# Patient Record
Sex: Female | Born: 1955 | Race: White | Hispanic: No | Marital: Married | Smoking: Never smoker
Health system: Southern US, Community
[De-identification: ages and names within clinical notes are randomized; demographics above are authoritative.]

## PROBLEM LIST (undated history)

## (undated) DIAGNOSIS — I10 Essential (primary) hypertension: Secondary | ICD-10-CM

## (undated) HISTORY — DX: Essential (primary) hypertension: I10

---

## 1998-06-14 ENCOUNTER — Other Ambulatory Visit: Admission: RE | Admit: 1998-06-14 | Discharge: 1998-06-14 | Payer: Self-pay | Admitting: Obstetrics and Gynecology

## 1999-07-17 ENCOUNTER — Other Ambulatory Visit: Admission: RE | Admit: 1999-07-17 | Discharge: 1999-07-17 | Payer: Self-pay | Admitting: Obstetrics and Gynecology

## 2000-07-30 ENCOUNTER — Other Ambulatory Visit: Admission: RE | Admit: 2000-07-30 | Discharge: 2000-07-30 | Payer: Self-pay | Admitting: Obstetrics and Gynecology

## 2001-10-07 ENCOUNTER — Other Ambulatory Visit: Admission: RE | Admit: 2001-10-07 | Discharge: 2001-10-07 | Payer: Self-pay | Admitting: Obstetrics and Gynecology

## 2002-11-02 ENCOUNTER — Other Ambulatory Visit: Admission: RE | Admit: 2002-11-02 | Discharge: 2002-11-02 | Payer: Self-pay | Admitting: Obstetrics and Gynecology

## 2003-11-09 ENCOUNTER — Other Ambulatory Visit: Admission: RE | Admit: 2003-11-09 | Discharge: 2003-11-09 | Payer: Self-pay | Admitting: Obstetrics and Gynecology

## 2004-02-22 ENCOUNTER — Encounter (INDEPENDENT_AMBULATORY_CARE_PROVIDER_SITE_OTHER): Payer: Self-pay | Admitting: Specialist

## 2004-02-22 ENCOUNTER — Ambulatory Visit (HOSPITAL_COMMUNITY): Admission: RE | Admit: 2004-02-22 | Discharge: 2004-02-22 | Payer: Self-pay | Admitting: Obstetrics and Gynecology

## 2004-11-28 ENCOUNTER — Other Ambulatory Visit: Admission: RE | Admit: 2004-11-28 | Discharge: 2004-11-28 | Payer: Self-pay | Admitting: Obstetrics and Gynecology

## 2007-07-29 ENCOUNTER — Encounter: Admission: RE | Admit: 2007-07-29 | Discharge: 2007-09-04 | Payer: Self-pay | Admitting: Family Medicine

## 2009-07-28 ENCOUNTER — Encounter: Admission: RE | Admit: 2009-07-28 | Discharge: 2009-08-22 | Payer: Self-pay | Admitting: Neurology

## 2010-01-10 ENCOUNTER — Ambulatory Visit: Payer: Self-pay | Admitting: Family Medicine

## 2010-01-10 DIAGNOSIS — M999 Biomechanical lesion, unspecified: Secondary | ICD-10-CM | POA: Insufficient documentation

## 2010-01-10 DIAGNOSIS — I10 Essential (primary) hypertension: Secondary | ICD-10-CM

## 2010-01-26 ENCOUNTER — Ambulatory Visit: Payer: Self-pay | Admitting: Family Medicine

## 2010-01-26 LAB — CONVERTED CEMR LAB
ALT: 14 units/L
AST: 19 units/L
Albumin: 4.7 g/dL
Alkaline Phosphatase: 67 units/L
GGT: 16 units/L
Glucose, Bld: 70 mg/dL
HDL: 95 mg/dL
Total Protein: 7 g/dL

## 2010-06-13 NOTE — Assessment & Plan Note (Signed)
Summary: np,df   Vital Signs:  Patient profile:   55 year old female Height:      61 inches Weight:      115.6 pounds BMI:     21.92 Temp:     98.6 degrees F oral Pulse rate:   106 / minute BP sitting:   144 / 85  (left arm) Cuff size:   regular  Vitals Entered By: Garen Grams LPN (January 10, 2010 3:21 PM) CC: New Patient Is Patient Diabetic? No Pain Assessment Patient in pain? no        Primary Care Provider:  Antoine Primas DO  CC:  New Patient.  History of Present Illness: 55 yo female with hx of htn here to establish care.  Pt is switching because pt had heard about OMT and though that would be of benefit.    1.  HTN- Well controlled with lisinopril 15mg  by mouth daily pt a little high today but states most of the time it is 120's SBP.  Pt denies HA visual changes abdominal pain weakness or numbness in extremities.  Pt states about 2 months ago had all labs done and will send them to [place].    2.  left sided back pain-  Pt is very active and recently has been hindered by back pain from some acitvity, sometimes it radiates to her leg but usually that is when she has knee pain as well.  notices the pain is worse when she wears certain tennis shoes but can happen in other shoes and with increaseing activity such as walking more than two miles.  Pt denies bowel or bladder problems, weakness in extremity or numbness, pt though feels when it does hurt she has pain on her left side of her bosy including her shoulder, neck, knee and ankle but always seems to start with her back.  Pt was told by a relative in law about OMT and would like to see if she benefits.  3.  Preventitive-  Pt goes to her gyn for her pap and mamopgram, had a coloncoscopy 5 years ago and is checking if she needs another one (was seen at [place]). Hx of basal cell on her face.   Habits & Providers  Alcohol-Tobacco-Diet     Tobacco Status: never  Current Medications (verified): 1)  Lisinopril 10 Mg Tabs  (Lisinopril) .Marland Kitchen.. 1 and 1/2 Tab Daily 2)  Aspirin 81 Mg Tabs (Aspirin) .Marland Kitchen.. 1 Tab Daily 3)  Fish Oil 500 Mg Caps (Omega-3 Fatty Acids) .... 3 Caps Daily 4)  Cvs Daily Multiple Plus Iron  Tabs (Multiple Vitamins-Iron)  Allergies (verified): No Known Drug Allergies  Past History:  Past Medical History: HTN Basal cell on face  Past Surgical History: C section in 1985  Family History: breast cancer in mother at 50 stroke in father at age 61  Social History: walks 6x a week for exercise, lives with husband and daughter , denies smoking and illicits ETOH rarely, 2 dogs works as a Youth worker. Smoking Status:  never  Review of Systems       see hpi but denies fever, chills, nausea, vomiting, diarrhea or constipation   Physical Exam  General:  Well-developed,well-nourished,in no acute distress; alert,appropriate and cooperative throughout examination Eyes:  PERRLA, EOMI Ears:  TM intact Mouth:  MMM Lungs:  CTAB Heart:  RRR no murmur Abdomen:  BS+, NT, ND Msk:  OMT findings T3RS right T7RSl elevated left 1st rib L2=3 RS left  left anterior sacrum  anterior left innominate mild left leg discrepency   Pulses:  R and L carotid,radial,femoral,dorsalis pedis and posterior tibial pulses are full and equal bilaterally Extremities:  No clubbing, cyanosis, edema, or deformity noted with normal full range of motion of all joints.   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 Told can return if needed more manipulation.  If continue pt does have mild leg length shortening on left that might be helped with lift and then would send  to sports medicine.  Orders: OMT 1-2 [facility] [phone])  Problem # 2:  ESSENTIAL HYPERTENSION, BENIGN (ICD-401.1) A little high today, if hgh again at next visit then would increase lisinopril to 20, would also get labs.  Pt is going to give [place] tohe recent labs she has had.  Her updated medication list for this problem includes:    Lisinopril 10 Mg Tabs (Lisinopril) .Marland Kitchen... 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  Orders: OMT 1-2 [facility] [phone])  Complete Medication List: 1)  Lisinopril 10 Mg Tabs (Lisinopril) .Marland Kitchen.. 1 and 1/2 tab daily 2)  Aspirin 81 Mg Tabs (Aspirin) .Marland Kitchen.. 1 tab daily 3)  Fish Oil 500 Mg Caps (Omega-3 fatty acids) .... 3 caps daily 4)  Cvs Daily Multiple Plus Iron Tabs (Multiple vitamins-iron)  Patient Instructions: 1)  Nice to meet you 2)  For your left side pain, we can do manipulation when you need it, hoepfully that won't be to frequently but we will play it by ear. 3)  If you have a flare please take motrin, you should feel better in the AM 4)  Do wall sits, 3 set of 30 seconds 1 time a day at 45 degree angle 5)  I want you to do hip flexor stretch, one leg down, back straight step forward with opposite leg until you feel a stretch, hold ten seconds, relax and then step a little further, do that 2 x daily.  6)  Continue the other exercises 7)  Cycling maybe a good option as well but the bike should be the right silze for you 8)  Please send in a copy of your lab work 9)  If you need anything else don't hesitate to call.  Prescriptions: LISINOPRIL 10 MG TABS (LISINOPRIL) 1 and 1/2 tab daily  #45 x 3   Entered and Authorized by:   Antoine Primas DO   Signed by:   Antoine Primas DO on 01/11/2010   Method used:   Historical   RxID:   [id number]

## 2010-06-13 NOTE — Assessment & Plan Note (Signed)
Summary: f/u eo   Vital Signs:  Patient profile:   55 year old female Height:      61 inches Weight:      114.9 pounds Temp:     98.4 degrees F oral Pulse rate:   94 / minute BP sitting:   116 / 73  (left arm) Cuff size:   regular  Vitals Entered By: Garen Grams LPN (January 26, 2010 2:26 PM)  Primary Care Provider:  Antoine Primas DO   History of Present Illness: 55 yo female with hx of htn here for f/u  1.  HTN- Well controlled with lisinopril 15mg  by mouth dailyPt at goal today  Pt denies HA visual changes abdominal pain weakness or numbness in extremities.  Pt brought labs from previous blood draw and will be put into flowsheet.   2.  left sided back pain-  Pt states that it is tremendously better has been able to do much more activity but still having a little pain from time to time.  has been doing exercises but wants something to help strenthenankle.  Pt also have a little more left sided upper back pain this time.  Pt states that it can go to the shoulder as well.  Pt deneis any numbness or loss of function in any of the extremities.     Current Medications (verified): 1)  Lisinopril 10 Mg Tabs (Lisinopril) .Marland Kitchen.. 1 and 1/2 Tab Daily 2)  Aspirin 81 Mg Tabs (Aspirin) .Marland Kitchen.. 1 Tab Daily 3)  Fish Oil 500 Mg Caps (Omega-3 Fatty Acids) .... 3 Caps Daily 4)  Cvs Daily Multiple Plus Iron  Tabs (Multiple Vitamins-Iron)  Allergies (verified): No Known Drug Allergies  Past History:  Past medical, surgical, family and social histories (including risk factors) reviewed, and no changes noted (except as noted below).  Past Medical History: Reviewed history from 01/10/2010 and no changes required. HTN Basal cell on face  Past Surgical History: Reviewed history from 01/10/2010 and no changes required. C section in 1985  Family History: Reviewed history from 01/10/2010 and no changes required. breast cancer in mother at 22 stroke in father at age 68  Social  History: Reviewed history from 01/10/2010 and no changes required. 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   Review of Systems       no bowel or bladder problems, no chest pain no SOB or abdominal pain  Physical Exam  General:  Well-developed,well-nourished,in no acute distress; alert,appropriate and cooperative throughout examination Mouth:  MMM Lungs:  CTAB Heart:  RRR no murmur Msk:  OMT findings T3RS right  elevated left 1st rib L2-3 RS left  left anterior sacrum anterior left innominate mild left leg discrepency- physilogical.   Extremities:  No clubbing, cyanosis, edema, or deformity noted with normal full range of motion of all joints.     Impression & Recommendations:  Problem # 1:  ESSENTIAL HYPERTENSION, BENIGN (ICD-401.1) stable at goal input numbers into flowsheet.  Her updated medication list for this problem includes:    Lisinopril 10 Mg Tabs (Lisinopril) .Marland Kitchen... 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  Pt very happy because insurence  covered almost entire visit so will be coming more frequently.  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 will monitor. does not seem to have a anatomical leg length prblem and all physiological. Orders: [facility]- Est Level  3 (99213) OMT 1-2 [facility] (57846)  Complete Medication List: 1)  Lisinopril 10 Mg Tabs (Lisinopril) .Marland Kitchen.. 1 and 1/2 tab daily 2)  Aspirin 81 Mg Tabs (Aspirin) .Marland Kitchen.. 1 tab  daily 3)  Fish Oil 500 Mg Caps (Omega-3 fatty acids) .... 3 caps daily 4)  Cvs Daily Multiple Plus Iron Tabs (Multiple vitamins-iron)

## 2010-06-17 ENCOUNTER — Encounter: Payer: Self-pay | Admitting: *Deleted

## 2010-09-13 ENCOUNTER — Ambulatory Visit (INDEPENDENT_AMBULATORY_CARE_PROVIDER_SITE_OTHER): Payer: BC Managed Care – PPO | Admitting: Family Medicine

## 2010-09-13 ENCOUNTER — Encounter: Payer: Self-pay | Admitting: Family Medicine

## 2010-09-13 DIAGNOSIS — M999 Biomechanical lesion, unspecified: Secondary | ICD-10-CM

## 2010-09-13 NOTE — Progress Notes (Signed)
  Subjective:    Patient ID: Laura Rios, female    DOB: Oct 11, 1955, 55 y.o.   MRN: [id number]  HPI  Pt is here for maipulation Pt had been seen for this before 7 months ago and was doing well up to the last 3-4 weeks.  Pt states she has some pain on her left side pt states it usually starts in her low back and can radiate to her knee notices it more when she sits, does notice relief if she stretches some, has noticed some different shoes make it better or worse, no numbness no LOF, still able to do ADL's Denies bowel or bladder problems or any type of trauma.   Review of Systems Denies fever, chills, nausea vomiting abdominal pain, dysuria, chest pain, shortness of breath dyspnea on exertion or numbness in extremities  Past medical, surgical and family hx were reviewed with no changes.     Objective:   Physical Exam    Gen: NAd Pul: CTAB CVV RRR  OMT Findings: Cervical: C2 left, C4 right Thoracic: T5 rotated and side bent right Lumbar:L2 rotated and side bent right Anterior left illium Sacrum:posterior left sacrum.      Assessment & Plan:

## 2010-09-13 NOTE — Assessment & Plan Note (Signed)
After verbal consent pt had HVLA on side and muscle energy prone with marked improvement.

## 2010-09-13 NOTE — Assessment & Plan Note (Signed)
Marked improvement after HVLA on supine with improvement in marked asymmetry noted earlier.

## 2010-09-29 NOTE — H&P (Signed)
Laura Rios, LEAR NO.:  [id number]   MEDICAL RECORD NO.:  [id number]          PATIENT TYPE:  AMB   LOCATION:  SDC                           FACILITY:  [facility]   PHYSICIAN:  Juluis Mire, M.D.   DATE OF BIRTH:  Dec 17, 1955   DATE OF ADMISSION:  DATE OF DISCHARGE:                                HISTORY & PHYSICAL   CHIEF COMPLAINT:  The patient is a 55 year old gravida 3 para 3 married  white female who presents for a hysteroscopy.   PRESENT ADMISSION:  The patient has been followed with menorrhagia.  She has  been having excessively heavier periods.  Cycles are approximately every 24-  26 days.  She occasionally does have two periods per month.  Hemoglobin was  noted to be slightly depressed.  Subsequently, we did a saline infusion  ultrasound in the office that revealed inhomogeneous myometrium consistent  with possible adenomyosis and a probable endometrial polyp.  Saline infusion  ultrasound confirmed the endometrial polyp and now she presents for a  hysteroscopic resection of the polyp.   ALLERGIES:  No known drug allergies.   MEDICATIONS:  Include Accupril and aspirin.   PAST MEDICAL HISTORY:  Usual childhood diseases without any significant  sequelae.   SURGICAL HISTORY:  She has had one prior cesarean section and two vaginal  deliveries.   FAMILY HISTORY:  Noncontributory.   SOCIAL HISTORY:  Reveals no tobacco or alcohol use.   REVIEW OF SYSTEMS:  Noncontributory.   PHYSICAL EXAMINATION:  VITAL SIGNS:  The patient is afebrile with stable  vital signs.  HEENT:  The patient is normocephalic.  Pupils equal, round, and reactive to  light and accommodation.  Extraocular movements were intact.  Sclerae and  conjunctivae were clear, oropharynx clear.  NECK:  Without thyromegaly.  BREASTS:  No discrete masses.  LUNGS:  Clear.  CARDIOVASCULAR:  Regular rate and rhythm without murmurs or gallops.  ABDOMEN:  Benign.  No mass, organomegaly, or  tenderness.  PELVIC:  Normal external genitalia, vaginal mucosa is clear.  Cervix  unremarkable.  Uterus normal size, shape, and contour.  Adnexa free of  masses or tenderness.  EXTREMITIES:  Trace edema.  NEUROLOGIC:  Grossly within normal limits.   IMPRESSION:  Endometrial polyp with increasing menstrual flow.   PLAN:  The patient to undergo a hysteroscopic evaluation and resection of  polyp.  The risks of surgery have been discussed including the risk of  infection; the risk of hemorrhage that could require transfusion with the  risk of AIDS or hepatitis; the risk of injury to adjacent organs including  bladder or bowel that could require further exploratory surgery; the risk of  deep venous thrombosis and pulmonary embolus.  The patient professed  understanding of the indications and risks.      JSM/MEDQ  D:  02/22/2004  T:  02/22/2004  Job:  [id number]

## 2010-09-29 NOTE — Op Note (Signed)
Laura Rios, Laura Rios                 ACCOUNT NO.:  [id number]   MEDICAL RECORD NO.:  [id number]          PATIENT TYPE:  AMB   LOCATION:  SDC                           FACILITY:  [facility]   PHYSICIAN:  Juluis Mire, M.D.   DATE OF BIRTH:  1956/03/14   DATE OF PROCEDURE:  02/22/2004  DATE OF DISCHARGE:                                 OPERATIVE REPORT   PREOPERATIVE DIAGNOSES:  Abnormal uterine bleeding. Endometrial polyp.   POSTOPERATIVE DIAGNOSES:  Abnormal uterine bleeding. Endometrial polyp.   PROCEDURE:  Paracervical block. Cervical dilation.  Hysteroscopy with  resection of polyp. Multiple endometrial biopsies, endometrial curettings.   SURGEON:  Juluis Mire, M.D.   ANESTHESIA:  Sedation and paracervical block.   ESTIMATED BLOOD LOSS:  Minimal.   PACKS/DRAINS:  None.   INTRAOPERATIVE BLOOD REPLACED:  None.   COMPLICATIONS:  None.   INDICATIONS FOR PROCEDURE:  Dictated in the history and physical.   DESCRIPTION OF PROCEDURE:  The patient was taken to the OR, placed in the  supine position. After a satisfactory level of general anesthesia was  obtained, the patient was placed in the dorsal lithotomy position using the  Allen stirrups.  The patient was then draped out for hysteroscopy.  A  speculum was placed in the vaginal vault. The cervix and vagina cleansed  with Betadine. A paracervical block was instituted using 1% Nesacaine, total  of 20 mL. The cervix was secured with a single tooth tenaculum, uterus  sounded to 8 cm. The cervix was serially dilated to a size 35 Pratt dilator.  The operative hysteroscope was introduce. The intrauterine cavity was  distended using sorbitol. A polyp was noted on the left anterior uterine  wall. This was resected in multiple stages and sent for pathologic review.  No other intrauterine pathology was noted. Multiple endometrial biopsies  were obtained along with curettings. There was no signs of perforation or  other complications. Total  deficit was 40-60 mL. The patient was taken out  of the dorsal lithotomy position, there was no active bleeding. The single  tooth tenaculum had been removed. Sponge, instrument and needle count was  reported as correct by circulating nurse. The patient was transferred to the  recovery room in good condition.    JSM/MEDQ  D:  02/22/2004  T:  02/22/2004  Job:  [id number]

## 2011-01-02 ENCOUNTER — Ambulatory Visit: Payer: BC Managed Care – PPO | Admitting: Family Medicine

## 2011-09-06 ENCOUNTER — Other Ambulatory Visit: Payer: Self-pay | Admitting: Dermatology

## 2014-02-25 ENCOUNTER — Other Ambulatory Visit: Payer: Self-pay | Admitting: Obstetrics and Gynecology

## 2014-02-26 LAB — CYTOLOGY - PAP

## 2016-07-06 DIAGNOSIS — L989 Disorder of the skin and subcutaneous tissue, unspecified: Secondary | ICD-10-CM | POA: Diagnosis not present

## 2016-09-24 DIAGNOSIS — R87619 Unspecified abnormal cytological findings in specimens from cervix uteri: Secondary | ICD-10-CM | POA: Diagnosis not present

## 2016-09-24 DIAGNOSIS — R87613 High grade squamous intraepithelial lesion on cytologic smear of cervix (HGSIL): Secondary | ICD-10-CM | POA: Diagnosis not present

## 2016-10-03 DIAGNOSIS — R87613 High grade squamous intraepithelial lesion on cytologic smear of cervix (HGSIL): Secondary | ICD-10-CM | POA: Diagnosis not present

## 2016-10-03 DIAGNOSIS — N870 Mild cervical dysplasia: Secondary | ICD-10-CM | POA: Diagnosis not present

## 2016-10-04 DIAGNOSIS — E785 Hyperlipidemia, unspecified: Secondary | ICD-10-CM | POA: Diagnosis not present

## 2016-10-04 DIAGNOSIS — I10 Essential (primary) hypertension: Secondary | ICD-10-CM | POA: Diagnosis not present

## 2016-11-27 DIAGNOSIS — N870 Mild cervical dysplasia: Secondary | ICD-10-CM | POA: Diagnosis not present

## 2016-11-27 DIAGNOSIS — R87610 Atypical squamous cells of undetermined significance on cytologic smear of cervix (ASC-US): Secondary | ICD-10-CM | POA: Diagnosis not present

## 2016-12-05 DIAGNOSIS — L821 Other seborrheic keratosis: Secondary | ICD-10-CM | POA: Diagnosis not present

## 2016-12-05 DIAGNOSIS — D1801 Hemangioma of skin and subcutaneous tissue: Secondary | ICD-10-CM | POA: Diagnosis not present

## 2016-12-05 DIAGNOSIS — Z85828 Personal history of other malignant neoplasm of skin: Secondary | ICD-10-CM | POA: Diagnosis not present

## 2017-01-21 DIAGNOSIS — H40023 Open angle with borderline findings, high risk, bilateral: Secondary | ICD-10-CM | POA: Diagnosis not present

## 2017-01-21 DIAGNOSIS — H2513 Age-related nuclear cataract, bilateral: Secondary | ICD-10-CM | POA: Diagnosis not present

## 2017-02-06 DIAGNOSIS — H6122 Impacted cerumen, left ear: Secondary | ICD-10-CM | POA: Diagnosis not present

## 2017-02-06 DIAGNOSIS — R0989 Other specified symptoms and signs involving the circulatory and respiratory systems: Secondary | ICD-10-CM | POA: Diagnosis not present

## 2017-02-20 NOTE — H&P (Signed)
Laura Rios, Laura Rios NO.:  [id number]  MEDICAL RECORD NO.:  [id number]  LOCATION:                                FACILITY:  [facility]  PHYSICIAN:  Darlyn Chamber, M.D.   DATE OF BIRTH:  January 11, 1956  DATE OF ADMISSION:  03/11/2017 DATE OF DISCHARGE:  03/11/2017                             HISTORY & PHYSICAL   DATE OF SURGERY:  March 11, 2017, at [facility] area on [place].  HISTORY OF PRESENT ILLNESS:  The patient is a 61 year old postmenopausal patient presents for laparoscopic-assisted vaginal hysterectomy with bilateral salpingo-oophorectomy.  She has undergone 3 loop electrode excision procedure __________ persistent low-grade dysplasia.  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  In view of persistent abnormality and discrepancy, the patient now presents for laparoscopic-assisted vaginal hysterectomy, removal of both tubes and ovaries.  ALLERGIES:  She has no known drug allergies.  MEDICATIONS:  Include lisinopril 50 mg a day, and otherwise vitamin D and aspirin.  PAST MEDICAL HISTORY:  She has had usual childhood diseases.  No significant issues.  She has had 1 C-section, 2 vaginal deliveries.  She is postmenopausal at the present time.  FAMILY HISTORY:  Significant for history of breast cancer.  SOCIAL HISTORY:  No tobacco or alcohol use.  REVIEW OF SYSTEMS:  Noncontributory.  PHYSICAL EXAMINATION:  VITAL SIGNS:  The patient is afebrile.  Stable vital signs. HEENT:  The patient is normocephalic.  Pupils equal, round, reactive to light and accommodation.  Extraocular movements were intact.  Sclerae and conjunctiva clear.  Oropharynx clear. NECK:  Without thyromegaly. BREASTS:  No discrete masses. LUNGS:  Clear. CARDIOVASCULAR:  Regular rate.  No murmurs or gallops.  No carotid or abdominal bruits. ABDOMEN:  Benign.  No mass, organomegaly,  or tenderness. PELVIC:  Normal external genitalia.  Vaginal mucosa is clear.  Cervix unremarkable.  Uterus normal size, shape, and contour.  Adnexa free of mass or tenderness. EXTREMITIES:  Trace edema. NEUROLOGIC:  Grossly within normal limits.  IMPRESSION:  Persistent abnormal cervical cytology, __________ histology and cytology.  PLAN:  The patient will undergo laparoscopic-assisted vaginal hysterectomy with removal of both tubes and ovaries.  Risks of surgery have been discussed including the risk of infection.  The risk of hemorrhage, could require transfusion, with the risk of AIDS or hepatitis.  Excessive bleeding could require further surgery.  Risk of injury to adjacent organs such as bowel that could require further exploratory surgery.  Risk of deep venous thrombosis and pulmonary embolus.  The patient expressed understanding of indications and risks.     Darlyn Chamber, M.D.     JSM/MEDQ  D:  02/20/2017  T:  02/20/2017  Job:  [id number]

## 2017-02-20 NOTE — H&P (Deleted)
  The note originally documented on this encounter has been moved the the encounter in which it belongs.  

## 2017-02-20 NOTE — H&P (Signed)
Patient name [facility] DICTATION# [id number] CSN# [id number]  Darlyn Chamber, MD 02/20/2017 6:21 AM

## 2017-03-06 DIAGNOSIS — Z803 Family history of malignant neoplasm of breast: Secondary | ICD-10-CM | POA: Diagnosis not present

## 2017-03-06 DIAGNOSIS — Z1231 Encounter for screening mammogram for malignant neoplasm of breast: Secondary | ICD-10-CM | POA: Diagnosis not present

## 2017-03-06 NOTE — Patient Instructions (Signed)
Laura Rios  03/06/2017      Your procedure is scheduled on 03/11/2017   Report to [place].M.  Call this number if you have problems the morning of surgery:[phone]             OUR ADDRESS IS [place] , WE ARE LOCATED IN [place].    Remember:  Do not eat food or drink liquids after midnight.  Take these medicines the morning of surgery with A SIP OF WATER  Do not wear jewelry, make-up or nail polish.  Do not wear lotions, powders, or perfumes, or deoderant.  Do not shave 48 hours prior to surgery.  Men may shave face and neck.  Do not bring valuables to the hospital.  [facility] is not responsible for any belongings or valuables.  Contacts, dentures or bridgework may not be worn into surgery.  Leave your suitcase in the car.  After surgery it may be brought to your room.  For patients admitted to the hospital, discharge time will be determined by your treatment team.   Special instructions:   Please read over the following fact sheets that you were given.    [facility] - Preparing for Surgery Before surgery, you can play an important role.  Because skin is not sterile, your skin needs to be as free of germs as possible.  You can reduce the number of germs on your skin by washing with CHG (chlorahexidine gluconate) soap before surgery.  CHG is an antiseptic cleaner which kills germs and bonds with the skin to continue killing germs even after washing. Please DO NOT use if you have an allergy to CHG or antibacterial soaps.  If your skin becomes reddened/irritated stop using the CHG and inform your nurse when you arrive at Short Stay. Do not shave (including legs and underarms) for at least 48 hours prior to the first CHG shower.  You may shave your face/neck. Please follow these instructions carefully:  1.  Shower with CHG Soap the night before surgery and the  morning of Surgery.  2.  If you  choose to wash your hair, wash your hair first as usual with your  normal  shampoo.  3.  After you shampoo, rinse your hair and body thoroughly to remove the  shampoo.                           4.  Use CHG as you would any other liquid soap.  You can apply chg directly  to the skin and wash                       Gently with a scrungie or clean washcloth.  5.  Apply the CHG Soap to your body ONLY FROM THE NECK DOWN.   Do not use on face/ open                           Wound or open sores. Avoid contact with eyes, ears mouth and genitals (private parts).                       Wash face,  Genitals (private parts) with your normal soap.  6.  Wash thoroughly, paying special attention to the area where your surgery  will be performed.  7.  Thoroughly rinse your body with warm water from the neck down.  8.  DO NOT shower/wash with your normal soap after using and rinsing off  the CHG Soap.                9.  Pat yourself dry with a clean towel.            10.  Wear clean pajamas.            11.  Place clean sheets on your bed the night of your first shower and do not  sleep with pets. Day of Surgery : Do not apply any lotions/deodorants the morning of surgery.  Please wear clean clothes to the hospital/surgery center.  FAILURE TO FOLLOW THESE INSTRUCTIONS MAY RESULT IN THE CANCELLATION OF YOUR SURGERY PATIENT SIGNATURE_________________________________  NURSE SIGNATURE__________________________________  ________________________________________________________________________

## 2017-03-07 DIAGNOSIS — Z01419 Encounter for gynecological examination (general) (routine) without abnormal findings: Secondary | ICD-10-CM | POA: Diagnosis not present

## 2017-03-08 ENCOUNTER — Encounter (HOSPITAL_COMMUNITY)
Admission: RE | Admit: 2017-03-08 | Discharge: 2017-03-08 | Disposition: A | Discharge status: Home / Self Care | Payer: 59 | Source: Ambulatory Visit | Attending: Obstetrics and Gynecology | Admitting: Obstetrics and Gynecology

## 2017-03-08 ENCOUNTER — Encounter (HOSPITAL_COMMUNITY): Payer: Self-pay | Admitting: *Deleted

## 2017-03-08 DIAGNOSIS — R87613 High grade squamous intraepithelial lesion on cytologic smear of cervix (HGSIL): Secondary | ICD-10-CM | POA: Diagnosis not present

## 2017-03-08 DIAGNOSIS — Y848 Other medical procedures as the cause of abnormal reaction of the patient, or of later complication, without mention of misadventure at the time of the procedure: Secondary | ICD-10-CM | POA: Diagnosis not present

## 2017-03-08 DIAGNOSIS — Y701 Therapeutic (nonsurgical) and rehabilitative anesthesiology devices associated with adverse incidents: Secondary | ICD-10-CM | POA: Diagnosis not present

## 2017-03-08 DIAGNOSIS — J9801 Acute bronchospasm: Secondary | ICD-10-CM | POA: Diagnosis not present

## 2017-03-08 DIAGNOSIS — J9588 Other intraoperative complications of respiratory system, not elsewhere classified: Secondary | ICD-10-CM | POA: Diagnosis not present

## 2017-03-08 DIAGNOSIS — Y92530 Ambulatory surgery center as the place of occurrence of the external cause: Secondary | ICD-10-CM | POA: Diagnosis not present

## 2017-03-08 DIAGNOSIS — Z538 Procedure and treatment not carried out for other reasons: Secondary | ICD-10-CM | POA: Diagnosis not present

## 2017-03-08 LAB — CBC
HCT: 39.6 % (ref 36.0–46.0)
HEMOGLOBIN: 13.4 g/dL (ref 12.0–15.0)
MCH: 31.6 pg (ref 26.0–34.0)
MCHC: 33.8 g/dL (ref 30.0–36.0)
MCV: 93.4 fL (ref 78.0–100.0)
Platelets: 244 10*3/uL (ref 150–400)
RBC: 4.24 MIL/uL (ref 3.87–5.11)
RDW: 12.8 % (ref 11.5–15.5)
WBC: 6.4 10*3/uL (ref 4.0–10.5)

## 2017-03-08 LAB — BASIC METABOLIC PANEL
ANION GAP: 8 (ref 5–15)
BUN: 8 mg/dL (ref 6–20)
CALCIUM: 9.5 mg/dL (ref 8.9–10.3)
CO2: 27 mmol/L (ref 22–32)
Chloride: 103 mmol/L (ref 101–111)
Creatinine, Ser: 0.88 mg/dL (ref 0.44–1.00)
GLUCOSE: 83 mg/dL (ref 65–99)
Potassium: 3.9 mmol/L (ref 3.5–5.1)
Sodium: 138 mmol/L (ref 135–145)

## 2017-03-08 LAB — ABO/RH: ABO/RH(D): O POS

## 2017-03-11 ENCOUNTER — Ambulatory Visit (HOSPITAL_BASED_OUTPATIENT_CLINIC_OR_DEPARTMENT_OTHER)
Admission: RE | Admit: 2017-03-11 | Discharge: 2017-03-11 | Disposition: A | Discharge status: Home / Self Care | Payer: 59 | Source: Ambulatory Visit | Attending: Obstetrics and Gynecology | Admitting: Obstetrics and Gynecology

## 2017-03-11 ENCOUNTER — Encounter (HOSPITAL_BASED_OUTPATIENT_CLINIC_OR_DEPARTMENT_OTHER): Payer: Self-pay | Admitting: *Deleted

## 2017-03-11 ENCOUNTER — Ambulatory Visit (HOSPITAL_BASED_OUTPATIENT_CLINIC_OR_DEPARTMENT_OTHER): Payer: 59 | Admitting: Anesthesiology

## 2017-03-11 ENCOUNTER — Encounter (HOSPITAL_BASED_OUTPATIENT_CLINIC_OR_DEPARTMENT_OTHER)
Admission: RE | Disposition: A | Discharge status: Home / Self Care | Payer: Self-pay | Source: Ambulatory Visit | Attending: Obstetrics and Gynecology

## 2017-03-11 ENCOUNTER — Encounter (HOSPITAL_BASED_OUTPATIENT_CLINIC_OR_DEPARTMENT_OTHER): Payer: Self-pay

## 2017-03-11 DIAGNOSIS — Z538 Procedure and treatment not carried out for other reasons: Secondary | ICD-10-CM | POA: Diagnosis not present

## 2017-03-11 DIAGNOSIS — R87613 High grade squamous intraepithelial lesion on cytologic smear of cervix (HGSIL): Secondary | ICD-10-CM | POA: Insufficient documentation

## 2017-03-11 DIAGNOSIS — I10 Essential (primary) hypertension: Secondary | ICD-10-CM | POA: Diagnosis not present

## 2017-03-11 DIAGNOSIS — J9801 Acute bronchospasm: Secondary | ICD-10-CM | POA: Insufficient documentation

## 2017-03-11 DIAGNOSIS — M9983 Other biomechanical lesions of lumbar region: Secondary | ICD-10-CM | POA: Diagnosis not present

## 2017-03-11 DIAGNOSIS — Y92530 Ambulatory surgery center as the place of occurrence of the external cause: Secondary | ICD-10-CM | POA: Insufficient documentation

## 2017-03-11 DIAGNOSIS — Y701 Therapeutic (nonsurgical) and rehabilitative anesthesiology devices associated with adverse incidents: Secondary | ICD-10-CM | POA: Insufficient documentation

## 2017-03-11 DIAGNOSIS — J9588 Other intraoperative complications of respiratory system, not elsewhere classified: Secondary | ICD-10-CM | POA: Insufficient documentation

## 2017-03-11 DIAGNOSIS — Y848 Other medical procedures as the cause of abnormal reaction of the patient, or of later complication, without mention of misadventure at the time of the procedure: Secondary | ICD-10-CM | POA: Insufficient documentation

## 2017-03-11 SURGERY — CANCELLED PROCEDURE
Anesthesia: General | Laterality: Bilateral

## 2017-03-11 MED ORDER — LIDOCAINE 2% (20 MG/ML) 5 ML SYRINGE
INTRAMUSCULAR | Status: AC
  Filled 2017-03-11: qty 5

## 2017-03-11 MED ORDER — NALOXONE HCL 0.4 MG/ML IJ SOLN
INTRAMUSCULAR | Status: DC | PRN
  Administered 2017-03-11: 80 ug via INTRAVENOUS

## 2017-03-11 MED ORDER — PHENYLEPHRINE 40 MCG/ML (10ML) SYRINGE FOR IV PUSH (FOR BLOOD PRESSURE SUPPORT)
PREFILLED_SYRINGE | INTRAVENOUS | Status: DC | PRN
  Administered 2017-03-11 (×4): 120 ug via INTRAVENOUS

## 2017-03-11 MED ORDER — MIDAZOLAM HCL 5 MG/5ML IJ SOLN
INTRAMUSCULAR | Status: DC | PRN
  Administered 2017-03-11: 2 mg via INTRAVENOUS

## 2017-03-11 MED ORDER — FENTANYL CITRATE (PF) 100 MCG/2ML IJ SOLN
INTRAMUSCULAR | Status: DC | PRN
  Administered 2017-03-11: 100 ug via INTRAVENOUS

## 2017-03-11 MED ORDER — EPINEPHRINE PF 1 MG/10ML IJ SOSY
PREFILLED_SYRINGE | INTRAMUSCULAR | Status: AC
  Filled 2017-03-11: qty 10

## 2017-03-11 MED ORDER — CEFAZOLIN SODIUM-DEXTROSE 2-4 GM/100ML-% IV SOLN
INTRAVENOUS | Status: AC
  Filled 2017-03-11: qty 100

## 2017-03-11 MED ORDER — DEXAMETHASONE SODIUM PHOSPHATE 10 MG/ML IJ SOLN
INTRAMUSCULAR | Status: DC | PRN
  Administered 2017-03-11: 10 mg via INTRAVENOUS

## 2017-03-11 MED ORDER — OXYCODONE HCL 5 MG PO TABS
5.0000 mg | ORAL_TABLET | Freq: Once | ORAL | Status: DC | PRN
  Filled 2017-03-11: qty 1

## 2017-03-11 MED ORDER — PHENYLEPHRINE HCL 10 MG/ML IJ SOLN
INTRAMUSCULAR | Status: DC | PRN
  Administered 2017-03-11: 100 ug/min via INTRAVENOUS

## 2017-03-11 MED ORDER — DEXAMETHASONE SODIUM PHOSPHATE 10 MG/ML IJ SOLN
INTRAMUSCULAR | Status: AC
  Filled 2017-03-11: qty 1

## 2017-03-11 MED ORDER — PHENYLEPHRINE HCL 10 MG/ML IJ SOLN
INTRAMUSCULAR | Status: AC
  Filled 2017-03-11: qty 2

## 2017-03-11 MED ORDER — PROPOFOL 10 MG/ML IV BOLUS
INTRAVENOUS | Status: DC | PRN
  Administered 2017-03-11: 150 mg via INTRAVENOUS
  Administered 2017-03-11: 60 mg via INTRAVENOUS

## 2017-03-11 MED ORDER — LIDOCAINE 2% (20 MG/ML) 5 ML SYRINGE
INTRAMUSCULAR | Status: DC | PRN
  Administered 2017-03-11: 80 mg via INTRAVENOUS

## 2017-03-11 MED ORDER — FENTANYL CITRATE (PF) 250 MCG/5ML IJ SOLN
INTRAMUSCULAR | Status: AC
  Filled 2017-03-11: qty 5

## 2017-03-11 MED ORDER — FENTANYL CITRATE (PF) 100 MCG/2ML IJ SOLN
25.0000 ug | INTRAMUSCULAR | Status: DC | PRN
  Filled 2017-03-11: qty 1

## 2017-03-11 MED ORDER — ALBUTEROL SULFATE HFA 108 (90 BASE) MCG/ACT IN AERS
INHALATION_SPRAY | RESPIRATORY_TRACT | Status: AC
  Filled 2017-03-11: qty 6.7

## 2017-03-11 MED ORDER — ROCURONIUM BROMIDE 50 MG/5ML IV SOSY
PREFILLED_SYRINGE | INTRAVENOUS | Status: AC
  Filled 2017-03-11: qty 5

## 2017-03-11 MED ORDER — ROCURONIUM BROMIDE 50 MG/5ML IV SOSY
PREFILLED_SYRINGE | INTRAVENOUS | Status: DC | PRN
  Administered 2017-03-11: 40 mg via INTRAVENOUS

## 2017-03-11 MED ORDER — PROMETHAZINE HCL 25 MG/ML IJ SOLN
6.2500 mg | INTRAMUSCULAR | Status: DC | PRN
  Filled 2017-03-11: qty 1

## 2017-03-11 MED ORDER — KETOROLAC TROMETHAMINE 30 MG/ML IJ SOLN
30.0000 mg | Freq: Once | INTRAMUSCULAR | Status: DC | PRN
  Filled 2017-03-11: qty 1

## 2017-03-11 MED ORDER — OXYCODONE HCL 5 MG/5ML PO SOLN
5.0000 mg | Freq: Once | ORAL | Status: DC | PRN
  Filled 2017-03-11: qty 5

## 2017-03-11 MED ORDER — MIDODRINE HCL 5 MG PO TABS
5.0000 mg | ORAL_TABLET | Freq: Once | ORAL | Status: AC
  Administered 2017-03-11: 5 mg via ORAL
  Filled 2017-03-11 (×2): qty 1

## 2017-03-11 MED ORDER — ALBUTEROL SULFATE HFA 108 (90 BASE) MCG/ACT IN AERS
INHALATION_SPRAY | RESPIRATORY_TRACT | Status: DC | PRN
  Administered 2017-03-11: 4 via RESPIRATORY_TRACT

## 2017-03-11 MED ORDER — LACTATED RINGERS IV SOLN
INTRAVENOUS | Status: DC
Start: 2017-03-11 — End: 2017-03-11
  Administered 2017-03-11: 1000 mL via INTRAVENOUS
  Administered 2017-03-11: 08:00:00 via INTRAVENOUS
  Filled 2017-03-11: qty 1000

## 2017-03-11 MED ORDER — SUGAMMADEX SODIUM 500 MG/5ML IV SOLN
INTRAVENOUS | Status: DC | PRN
  Administered 2017-03-11 (×2): 200 mg via INTRAVENOUS

## 2017-03-11 MED ORDER — EPINEPHRINE PF 1 MG/10ML IJ SOSY
PREFILLED_SYRINGE | INTRAMUSCULAR | Status: DC | PRN
  Administered 2017-03-11 (×3): 0.2 mg via INTRAVENOUS

## 2017-03-11 MED ORDER — PROPOFOL 10 MG/ML IV BOLUS
INTRAVENOUS | Status: AC
  Filled 2017-03-11: qty 20

## 2017-03-11 MED ORDER — SUGAMMADEX SODIUM 200 MG/2ML IV SOLN
INTRAVENOUS | Status: AC
  Filled 2017-03-11: qty 4

## 2017-03-11 MED ORDER — CEFAZOLIN SODIUM-DEXTROSE 2-4 GM/100ML-% IV SOLN
2.0000 g | INTRAVENOUS | Status: AC
  Administered 2017-03-11: .2 g via INTRAVENOUS
  Filled 2017-03-11: qty 100

## 2017-03-11 MED ORDER — NALOXONE HCL 0.4 MG/ML IJ SOLN
INTRAMUSCULAR | Status: AC
  Filled 2017-03-11: qty 1

## 2017-03-11 MED ORDER — MIDAZOLAM HCL 2 MG/2ML IJ SOLN
INTRAMUSCULAR | Status: AC
  Filled 2017-03-11: qty 2

## 2017-03-11 MED ORDER — PHENYLEPHRINE 40 MCG/ML (10ML) SYRINGE FOR IV PUSH (FOR BLOOD PRESSURE SUPPORT)
PREFILLED_SYRINGE | INTRAVENOUS | Status: AC
  Filled 2017-03-11: qty 10

## 2017-03-11 SURGICAL SUPPLY — 61 items
BLADE CLIPPER SURG (BLADE) IMPLANT
CANISTER SUCT 3000ML PPV (MISCELLANEOUS) IMPLANT
CATH ROBINSON RED A/P 16FR (CATHETERS) IMPLANT
CLOSURE WOUND 1/4X4 (GAUZE/BANDAGES/DRESSINGS)
CLOTH BEACON ORANGE TIMEOUT ST (SAFETY) IMPLANT
COVER BACK TABLE 60X90IN (DRAPES) IMPLANT
COVER MAYO STAND STRL (DRAPES) IMPLANT
DERMABOND ADVANCED (GAUZE/BANDAGES/DRESSINGS)
DERMABOND ADVANCED .7 DNX12 (GAUZE/BANDAGES/DRESSINGS) IMPLANT
DRSG OPSITE POSTOP 3X4 (GAUZE/BANDAGES/DRESSINGS) IMPLANT
ELECT REM PT RETURN 9FT ADLT (ELECTROSURGICAL)
ELECTRODE REM PT RTRN 9FT ADLT (ELECTROSURGICAL) IMPLANT
FILTER SMOKE EVAC LAPAROSHD (FILTER) IMPLANT
GLOVE BIO SURGEON STRL SZ7 (GLOVE) IMPLANT
GLOVE BIOGEL PI IND STRL 6.5 (GLOVE) IMPLANT
GLOVE BIOGEL PI INDICATOR 6.5 (GLOVE)
HOLDER FOLEY CATH W/STRAP (MISCELLANEOUS) IMPLANT
KIT RM TURNOVER CYSTO AR (KITS) IMPLANT
LEGGING LITHOTOMY PAIR STRL (DRAPES) IMPLANT
NEEDLE INSUFFLATION 14GA 120MM (NEEDLE) IMPLANT
NEEDLE INSUFFLATION 14GA 150MM (NEEDLE) IMPLANT
NS IRRIG 500ML POUR BTL (IV SOLUTION) IMPLANT
PACK LAVH (CUSTOM PROCEDURE TRAY) IMPLANT
PACK ROBOTIC GOWN (GOWN DISPOSABLE) IMPLANT
PACK TRENDGUARD 450 HYBRID PRO (MISCELLANEOUS) IMPLANT
PACK TRENDGUARD 600 HYBRD PROC (MISCELLANEOUS) IMPLANT
PAD OB MATERNITY 4.3X12.25 (PERSONAL CARE ITEMS) IMPLANT
PAD PREP 24X48 CUFFED NSTRL (MISCELLANEOUS) IMPLANT
POUCH SPECIMEN RETRIEVAL 10MM (ENDOMECHANICALS) IMPLANT
SCISSORS LAP 5X35 DISP (ENDOMECHANICALS) IMPLANT
SCISSORS LAP 5X45 EPIX DISP (ENDOMECHANICALS) IMPLANT
SEALER TISSUE G2 CVD JAW 45CM (ENDOMECHANICALS) IMPLANT
SET IRRIG TUBING LAPAROSCOPIC (IRRIGATION / IRRIGATOR) IMPLANT
SET IRRIG Y TYPE TUR BLADDER L (SET/KITS/TRAYS/PACK) IMPLANT
SOLUTION ELECTROLUBE (MISCELLANEOUS) IMPLANT
STRIP CLOSURE SKIN 1/4X4 (GAUZE/BANDAGES/DRESSINGS) IMPLANT
SUT MNCRL AB 4-0 PS2 18 (SUTURE) IMPLANT
SUT MON AB 2-0 CT1 36 (SUTURE) IMPLANT
SUT VIC AB 0 CT1 18XCR BRD8 (SUTURE) IMPLANT
SUT VIC AB 0 CT1 36 (SUTURE) IMPLANT
SUT VIC AB 0 CT1 8-18 (SUTURE)
SUT VIC AB 2-0 CT1 (SUTURE) IMPLANT
SUT VIC AB 2-0 SH 27 (SUTURE)
SUT VIC AB 2-0 SH 27XBRD (SUTURE) IMPLANT
SUT VIC AB 3-0 SH 27 (SUTURE)
SUT VIC AB 3-0 SH 27X BRD (SUTURE) IMPLANT
SUT VICRYL 0 UR6 27IN ABS (SUTURE) IMPLANT
SUT VICRYL 1 TIES 12X18 (SUTURE) IMPLANT
SUT VICRYL 4-0 PS2 18IN ABS (SUTURE) IMPLANT
SYR BULB IRRIGATION 50ML (SYRINGE) IMPLANT
TOWEL OR 17X24 6PK STRL BLUE (TOWEL DISPOSABLE) IMPLANT
TRAY FOLEY CATH SILVER 14FR (SET/KITS/TRAYS/PACK) IMPLANT
TRENDGUARD 450 HYBRID PRO PACK (MISCELLANEOUS)
TRENDGUARD 600 HYBRID PROC PK (MISCELLANEOUS)
TROCAR BALLN 12MMX100 BLUNT (TROCAR) IMPLANT
TROCAR OPTI TIP 5M 100M (ENDOMECHANICALS) IMPLANT
TROCAR XCEL 12X100 BLDLESS (ENDOMECHANICALS) IMPLANT
TROCAR XCEL DIL TIP R 11M (ENDOMECHANICALS) IMPLANT
TUBING INSUF HEATED (TUBING) IMPLANT
WARMER LAPAROSCOPE (MISCELLANEOUS) IMPLANT
WATER STERILE IRR 500ML POUR (IV SOLUTION) IMPLANT

## 2017-03-11 NOTE — Transfer of Care (Signed)
Immediate Anesthesia Transfer of Care Note  Patient: Laura Rios  Procedure(s) Performed: LAPAROSCOPIC ASSISTED VAGINAL HYSTERECTOMY WITH SALPINGO OOPHORECTOMY (Bilateral )  Patient Location: PACU  Anesthesia Type:General  Level of Consciousness: awake, alert  and oriented  Airway & Oxygen Therapy: Patient Spontanous Breathing and Patient connected to nasal cannula oxygen  Post-op Assessment: Report given to RN  Post vital signs: Reviewed and stable  Last Vitals:  Vitals:   03/11/17 0814 03/11/17 0815  BP: (!) 81/57 (!) (P) 86/57  Pulse: 67   Resp: 14   Temp: 36.5 C   SpO2: 100%     Last Pain:  Vitals:   03/11/17 0547  TempSrc: Oral      Patients Stated Pain Goal: 8 (73/66/81 5947)  Complications: respiratory complications, Bronchospasm after intubation.  Possible Rocuronium allergic reaction

## 2017-03-11 NOTE — Discharge Instructions (Signed)

## 2017-03-11 NOTE — Anesthesia Procedure Notes (Signed)
Procedure Name: Intubation Date/Time: 03/11/2017 7:34 AM Performed by: Bethena Roys T Pre-anesthesia Checklist: Patient identified, Emergency Drugs available, Suction available and Patient being monitored Patient Re-evaluated:Patient Re-evaluated prior to induction Oxygen Delivery Method: Circle system utilized Preoxygenation: Pre-oxygenation with 100% oxygen Induction Type: IV induction Ventilation: Mask ventilation without difficulty Laryngoscope Size: Mac and 3 Grade View: Grade I Tube type: Oral Tube size: 7.0 mm Number of attempts: 1 Airway Equipment and Method: Stylet Placement Confirmation: ETT inserted through vocal cords under direct vision,  positive ETCO2 and breath sounds checked- equal and bilateral Secured at: 20 cm Dental Injury: Teeth and Oropharynx as per pre-operative assessment

## 2017-03-11 NOTE — Anesthesia Postprocedure Evaluation (Signed)
Anesthesia Post Note  Patient: Laura Rios  Procedure(s) Performed: CANCELLED PROCEDURE     Anesthesia Type: General Comments: Patient had allergic reaction to Rocuronium, with severe bronchospasm requiring epinephrine to break it. Please see quick note. Recommend a different relaxant for her return    Last Vitals:  Vitals:   03/11/17 1000 03/11/17 1015  BP: (!) 87/62 95/67  Pulse: 67   Resp: 19   Temp:    SpO2: 97%     Last Pain:  Vitals:   03/11/17 0547  TempSrc: Oral                 Alando Colleran S

## 2017-03-11 NOTE — Progress Notes (Signed)
PT RESCHEDULED FROM TODAY TO Thursday 03-14-2017 ( CASE CANCELLED INTRAOPERATIVELY, ANESTHESIOLOGIST WAS  DR ROSE MDA).  CALLED AND SPOKE W/ PT VIA PHONE.  PT VERBALIZED UNDERSTANDING TO ARRIVE AT 0530.  NPO AFTER MN.  CURRENT CBC, CMET AND EKG  IN CHART AND EPIC.  PRE-OP ORDERS PENDING.

## 2017-03-11 NOTE — H&P (Signed)
  History and physical exam unchanged 

## 2017-03-11 NOTE — Anesthesia Preprocedure Evaluation (Addendum)
Anesthesia Evaluation  Patient identified by MRN, date of birth, ID band Patient awake    Reviewed: Allergy & Precautions, NPO status , Patient's Chart, lab work & pertinent test results  Airway Mallampati: II  TM Distance: >3 FB Neck ROM: Full    Dental no notable dental hx.    Pulmonary neg pulmonary ROS,    Pulmonary exam normal breath sounds clear to auscultation       Cardiovascular hypertension, negative cardio ROS Normal cardiovascular exam Rhythm:Regular Rate:Normal     Neuro/Psych negative neurological ROS  negative psych ROS   GI/Hepatic negative GI ROS, Neg liver ROS,   Endo/Other  negative endocrine ROS  Renal/GU negative Renal ROS  negative genitourinary   Musculoskeletal negative musculoskeletal ROS (+)   Abdominal   Peds negative pediatric ROS (+)  Hematology negative hematology ROS (+)   Anesthesia Other Findings   Reproductive/Obstetrics negative OB ROS                             Anesthesia Physical Anesthesia Plan  ASA: II  Anesthesia Plan: General   Post-op Pain Management:    Induction: Intravenous  PONV Risk Score and Plan: 2 and Ondansetron and Dexamethasone  Airway Management Planned: Oral ETT  Additional Equipment:   Intra-op Plan:   Post-operative Plan: Extubation in OR  Informed Consent: I have reviewed the patients History and Physical, chart, labs and discussed the procedure including the risks, benefits and alternatives for the proposed anesthesia with the patient or authorized representative who has indicated his/her understanding and acceptance.   Dental advisory given  Plan Discussed with: CRNA and Surgeon  Anesthesia Plan Comments:         Anesthesia Quick Evaluation

## 2017-03-12 NOTE — Discharge Summary (Signed)
NAMEMarland Kitchen  MAELEIGH, BUSCHMAN NO.:  [id number]  MEDICAL RECORD NO.:  [id number]  LOCATION:                                 FACILITY:  PHYSICIAN:  Darlyn Chamber, M.D.        DATE OF BIRTH:  DATE OF ADMISSION: DATE OF DISCHARGE:                              DISCHARGE SUMMARY   The patient presented for laparoscopic-assisted vaginal hysterectomy, bilateral salpingo-oophorectomy __________ spasm.  She __________ respond to treatment with medication __________ proceed with __________ followup in the office.     Darlyn Chamber, M.D.     JSM/MEDQ  D:  03/11/2017  T:  03/12/2017  Job:  [id number]

## 2017-03-13 NOTE — Anesthesia Preprocedure Evaluation (Addendum)
Anesthesia Evaluation  Patient identified by MRN, date of birth, ID band Patient awake    Reviewed: Allergy & Precautions, NPO status , Patient's Chart, lab work & pertinent test results  Airway Mallampati: II  TM Distance: >3 FB Neck ROM: Full    Dental no notable dental hx. (+) Teeth Intact, Dental Advisory Given   Pulmonary neg pulmonary ROS,    Pulmonary exam normal breath sounds clear to auscultation       Cardiovascular hypertension, Pt. on medications Normal cardiovascular exam Rhythm:Regular Rate:Normal     Neuro/Psych Anxiety negative neurological ROS     GI/Hepatic negative GI ROS, Neg liver ROS,   Endo/Other  negative endocrine ROS  Renal/GU negative Renal ROS  negative genitourinary   Musculoskeletal negative musculoskeletal ROS (+)   Abdominal   Peds negative pediatric ROS (+)  Hematology negative hematology ROS (+)   Anesthesia Other Findings   Reproductive/Obstetrics negative OB ROS                            Anesthesia Physical Anesthesia Plan  ASA: II  Anesthesia Plan: General   Post-op Pain Management:    Induction: Intravenous  PONV Risk Score and Plan: 3 and Ondansetron, Dexamethasone and Midazolam  Airway Management Planned: Oral ETT  Additional Equipment:   Intra-op Plan:   Post-operative Plan: Extubation in OR  Informed Consent: I have reviewed the patients History and Physical, chart, labs and discussed the procedure including the risks, benefits and alternatives for the proposed anesthesia with the patient or authorized representative who has indicated his/her understanding and acceptance.   Dental advisory given  Plan Discussed with: CRNA and Surgeon  Anesthesia Plan Comments: (Allergic to rocuronium Will use cisatracurium)        Anesthesia Quick Evaluation

## 2017-03-14 ENCOUNTER — Ambulatory Visit (HOSPITAL_BASED_OUTPATIENT_CLINIC_OR_DEPARTMENT_OTHER): Payer: 59 | Admitting: Anesthesiology

## 2017-03-14 ENCOUNTER — Encounter (HOSPITAL_BASED_OUTPATIENT_CLINIC_OR_DEPARTMENT_OTHER)
Admission: RE | Disposition: A | Discharge status: Home / Self Care | Payer: Self-pay | Source: Ambulatory Visit | Attending: Obstetrics and Gynecology

## 2017-03-14 ENCOUNTER — Encounter (HOSPITAL_BASED_OUTPATIENT_CLINIC_OR_DEPARTMENT_OTHER): Payer: Self-pay | Admitting: *Deleted

## 2017-03-14 ENCOUNTER — Observation Stay (HOSPITAL_BASED_OUTPATIENT_CLINIC_OR_DEPARTMENT_OTHER)
Admission: RE | Admit: 2017-03-14 | Discharge: 2017-03-15 | Disposition: A | Discharge status: Home / Self Care | Payer: 59 | Source: Ambulatory Visit | Attending: Obstetrics and Gynecology | Admitting: Obstetrics and Gynecology

## 2017-03-14 DIAGNOSIS — I10 Essential (primary) hypertension: Secondary | ICD-10-CM | POA: Insufficient documentation

## 2017-03-14 DIAGNOSIS — N959 Unspecified menopausal and perimenopausal disorder: Secondary | ICD-10-CM | POA: Insufficient documentation

## 2017-03-14 DIAGNOSIS — Z79899 Other long term (current) drug therapy: Secondary | ICD-10-CM | POA: Insufficient documentation

## 2017-03-14 DIAGNOSIS — N879 Dysplasia of cervix uteri, unspecified: Secondary | ICD-10-CM | POA: Diagnosis not present

## 2017-03-14 DIAGNOSIS — N888 Other specified noninflammatory disorders of cervix uteri: Secondary | ICD-10-CM | POA: Diagnosis not present

## 2017-03-14 DIAGNOSIS — Z90710 Acquired absence of both cervix and uterus: Secondary | ICD-10-CM | POA: Diagnosis present

## 2017-03-14 DIAGNOSIS — M9984 Other biomechanical lesions of sacral region: Secondary | ICD-10-CM | POA: Diagnosis not present

## 2017-03-14 DIAGNOSIS — N871 Moderate cervical dysplasia: Secondary | ICD-10-CM | POA: Diagnosis not present

## 2017-03-14 DIAGNOSIS — Z803 Family history of malignant neoplasm of breast: Secondary | ICD-10-CM | POA: Insufficient documentation

## 2017-03-14 HISTORY — PX: LAPAROSCOPIC VAGINAL HYSTERECTOMY WITH SALPINGO OOPHORECTOMY: SHX6681

## 2017-03-14 HISTORY — PX: CYSTOSCOPY: SHX5120

## 2017-03-14 LAB — TYPE AND SCREEN
ABO/RH(D): O POS
ABO/RH(D): O POS
ANTIBODY SCREEN: NEGATIVE
ANTIBODY SCREEN: NEGATIVE

## 2017-03-14 SURGERY — HYSTERECTOMY, VAGINAL, LAPAROSCOPY-ASSISTED, WITH SALPINGO-OOPHORECTOMY
Anesthesia: General | Site: Bladder

## 2017-03-14 MED ORDER — GLYCOPYRROLATE 0.2 MG/ML IV SOSY
PREFILLED_SYRINGE | INTRAVENOUS | Status: AC
  Filled 2017-03-14: qty 5

## 2017-03-14 MED ORDER — ARTIFICIAL TEARS OPHTHALMIC OINT
TOPICAL_OINTMENT | OPHTHALMIC | Status: AC
  Filled 2017-03-14: qty 3.5

## 2017-03-14 MED ORDER — PRENATAL MULTIVITAMIN CH
1.0000 | ORAL_TABLET | Freq: Every day | ORAL | Status: DC
  Administered 2017-03-14: 1 via ORAL
  Filled 2017-03-14 (×2): qty 1

## 2017-03-14 MED ORDER — STERILE WATER FOR IRRIGATION IR SOLN
Status: DC | PRN
  Administered 2017-03-14: 3000 mL

## 2017-03-14 MED ORDER — OXYCODONE-ACETAMINOPHEN 5-325 MG PO TABS
ORAL_TABLET | ORAL | Status: AC
  Filled 2017-03-14: qty 1

## 2017-03-14 MED ORDER — BUPIVACAINE HCL (PF) 0.25 % IJ SOLN
INTRAMUSCULAR | Status: DC | PRN
  Administered 2017-03-14: 6 mL

## 2017-03-14 MED ORDER — EPHEDRINE 5 MG/ML INJ
INTRAVENOUS | Status: AC
  Filled 2017-03-14: qty 10

## 2017-03-14 MED ORDER — LIDOCAINE 2% (20 MG/ML) 5 ML SYRINGE
INTRAMUSCULAR | Status: AC
  Filled 2017-03-14: qty 5

## 2017-03-14 MED ORDER — NEOSTIGMINE METHYLSULFATE 5 MG/5ML IV SOSY
PREFILLED_SYRINGE | INTRAVENOUS | Status: AC
  Filled 2017-03-14: qty 5

## 2017-03-14 MED ORDER — KETOROLAC TROMETHAMINE 30 MG/ML IJ SOLN
INTRAMUSCULAR | Status: AC
  Filled 2017-03-14: qty 1

## 2017-03-14 MED ORDER — CIPROFLOXACIN IN D5W 400 MG/200ML IV SOLN
400.0000 mg | INTRAVENOUS | Status: AC
  Administered 2017-03-14: 400 mg via INTRAVENOUS
  Filled 2017-03-14: qty 200

## 2017-03-14 MED ORDER — ONDANSETRON HCL 4 MG/2ML IJ SOLN
4.0000 mg | Freq: Four times a day (QID) | INTRAMUSCULAR | Status: DC | PRN
  Filled 2017-03-14: qty 2

## 2017-03-14 MED ORDER — PROPOFOL 10 MG/ML IV BOLUS
INTRAVENOUS | Status: DC | PRN
  Administered 2017-03-14: 120 mg via INTRAVENOUS

## 2017-03-14 MED ORDER — DEXAMETHASONE SODIUM PHOSPHATE 10 MG/ML IJ SOLN
INTRAMUSCULAR | Status: AC
  Filled 2017-03-14: qty 1

## 2017-03-14 MED ORDER — KETOROLAC TROMETHAMINE 30 MG/ML IJ SOLN
30.0000 mg | Freq: Once | INTRAMUSCULAR | Status: AC
  Administered 2017-03-14: 30 mg via INTRAVENOUS
  Filled 2017-03-14: qty 1

## 2017-03-14 MED ORDER — ONDANSETRON HCL 4 MG/2ML IJ SOLN
INTRAMUSCULAR | Status: DC | PRN
  Administered 2017-03-14: 4 mg via INTRAVENOUS

## 2017-03-14 MED ORDER — ONDANSETRON HCL 4 MG/2ML IJ SOLN
INTRAMUSCULAR | Status: AC
  Filled 2017-03-14: qty 2

## 2017-03-14 MED ORDER — MIDAZOLAM HCL 2 MG/2ML IJ SOLN
INTRAMUSCULAR | Status: AC
  Filled 2017-03-14: qty 2

## 2017-03-14 MED ORDER — PROMETHAZINE HCL 25 MG/ML IJ SOLN
6.2500 mg | INTRAMUSCULAR | Status: DC | PRN
  Filled 2017-03-14: qty 1

## 2017-03-14 MED ORDER — EPHEDRINE SULFATE 50 MG/ML IJ SOLN
INTRAMUSCULAR | Status: DC | PRN
  Administered 2017-03-14: 10 mg via INTRAVENOUS

## 2017-03-14 MED ORDER — ONDANSETRON HCL 4 MG PO TABS
4.0000 mg | ORAL_TABLET | Freq: Four times a day (QID) | ORAL | Status: DC | PRN
  Filled 2017-03-14: qty 1

## 2017-03-14 MED ORDER — HYDROMORPHONE HCL 1 MG/ML IJ SOLN
0.2500 mg | INTRAMUSCULAR | Status: DC | PRN
  Filled 2017-03-14: qty 0.5

## 2017-03-14 MED ORDER — NEOSTIGMINE METHYLSULFATE 10 MG/10ML IV SOLN
INTRAVENOUS | Status: DC | PRN
  Administered 2017-03-14: 3 mg via INTRAVENOUS

## 2017-03-14 MED ORDER — MIDAZOLAM HCL 2 MG/2ML IJ SOLN
INTRAMUSCULAR | Status: DC | PRN
  Administered 2017-03-14: 2 mg via INTRAVENOUS

## 2017-03-14 MED ORDER — LIDOCAINE 2% (20 MG/ML) 5 ML SYRINGE
INTRAMUSCULAR | Status: DC | PRN
  Administered 2017-03-14: 60 mg via INTRAVENOUS

## 2017-03-14 MED ORDER — OXYCODONE-ACETAMINOPHEN 5-325 MG PO TABS
1.0000 | ORAL_TABLET | ORAL | Status: DC | PRN
  Administered 2017-03-14 (×2): 1 via ORAL
  Filled 2017-03-14: qty 2

## 2017-03-14 MED ORDER — CLINDAMYCIN PHOSPHATE 900 MG/50ML IV SOLN
900.0000 mg | INTRAVENOUS | Status: AC
  Administered 2017-03-14: 900 mg via INTRAVENOUS
  Filled 2017-03-14: qty 50

## 2017-03-14 MED ORDER — ACETAMINOPHEN 325 MG PO TABS
650.0000 mg | ORAL_TABLET | ORAL | Status: DC | PRN
  Administered 2017-03-14: 650 mg via ORAL
  Filled 2017-03-14: qty 2

## 2017-03-14 MED ORDER — FENTANYL CITRATE (PF) 100 MCG/2ML IJ SOLN
INTRAMUSCULAR | Status: DC | PRN
Start: 2017-03-14 — End: 2017-03-14
  Administered 2017-03-14: 25 ug via INTRAVENOUS
  Administered 2017-03-14: 50 ug via INTRAVENOUS
  Administered 2017-03-14: 75 ug via INTRAVENOUS

## 2017-03-14 MED ORDER — ACETAMINOPHEN 325 MG PO TABS
ORAL_TABLET | ORAL | Status: AC
  Filled 2017-03-14: qty 2

## 2017-03-14 MED ORDER — CLINDAMYCIN PHOSPHATE 900 MG/50ML IV SOLN
INTRAVENOUS | Status: AC
  Filled 2017-03-14: qty 50

## 2017-03-14 MED ORDER — PROPOFOL 10 MG/ML IV BOLUS
INTRAVENOUS | Status: AC
  Filled 2017-03-14: qty 40

## 2017-03-14 MED ORDER — CISATRACURIUM BESYLATE (PF) 10 MG/5ML IV SOLN
INTRAVENOUS | Status: DC | PRN
Start: 2017-03-14 — End: 2017-03-14
  Administered 2017-03-14: 10 mg via INTRAVENOUS

## 2017-03-14 MED ORDER — SODIUM CHLORIDE 0.9 % IR SOLN
Status: DC | PRN
  Administered 2017-03-14: 1000 mL

## 2017-03-14 MED ORDER — CISATRACURIUM BESYLATE 20 MG/10ML IV SOLN
INTRAVENOUS | Status: AC
Start: 2017-03-14 — End: 2017-03-14
  Filled 2017-03-14: qty 10

## 2017-03-14 MED ORDER — FENTANYL CITRATE (PF) 250 MCG/5ML IJ SOLN
INTRAMUSCULAR | Status: AC
  Filled 2017-03-14: qty 5

## 2017-03-14 MED ORDER — DEXAMETHASONE SODIUM PHOSPHATE 10 MG/ML IJ SOLN
INTRAMUSCULAR | Status: DC | PRN
  Administered 2017-03-14: 10 mg via INTRAVENOUS

## 2017-03-14 MED ORDER — CIPROFLOXACIN IN D5W 400 MG/200ML IV SOLN
INTRAVENOUS | Status: AC
  Filled 2017-03-14: qty 200

## 2017-03-14 MED ORDER — GLYCOPYRROLATE 0.2 MG/ML IJ SOLN
INTRAMUSCULAR | Status: DC | PRN
  Administered 2017-03-14: 0.4 mg via INTRAVENOUS

## 2017-03-14 MED ORDER — ACETAMINOPHEN 10 MG/ML IV SOLN
INTRAVENOUS | Status: AC
  Filled 2017-03-14: qty 100

## 2017-03-14 MED ORDER — LACTATED RINGERS IV SOLN
INTRAVENOUS | Status: DC
  Administered 2017-03-14 (×2): via INTRAVENOUS
  Filled 2017-03-14: qty 1000

## 2017-03-14 MED ORDER — LACTATED RINGERS IV SOLN
INTRAVENOUS | Status: DC
  Filled 2017-03-14: qty 1000

## 2017-03-14 MED ORDER — KETOROLAC TROMETHAMINE 30 MG/ML IJ SOLN
30.0000 mg | Freq: Once | INTRAMUSCULAR | Status: DC | PRN
  Filled 2017-03-14: qty 1

## 2017-03-14 MED ORDER — ACETAMINOPHEN 10 MG/ML IV SOLN
INTRAVENOUS | Status: DC | PRN
  Administered 2017-03-14: 15 mg via INTRAVENOUS

## 2017-03-14 SURGICAL SUPPLY — 73 items
BAG DRAIN URO-CYSTO SKYTR STRL (DRAIN) ×4 IMPLANT
BLADE CLIPPER SURG (BLADE) IMPLANT
CANISTER SUCT 3000ML PPV (MISCELLANEOUS) ×4 IMPLANT
CATH ROBINSON RED A/P 16FR (CATHETERS) ×4 IMPLANT
CLOSURE WOUND 1/4X4 (GAUZE/BANDAGES/DRESSINGS)
CLOTH BEACON ORANGE TIMEOUT ST (SAFETY) ×4 IMPLANT
COVER BACK TABLE 60X90IN (DRAPES) ×4 IMPLANT
COVER MAYO STAND STRL (DRAPES) ×8 IMPLANT
COVER SURGICAL LIGHT HANDLE (MISCELLANEOUS) IMPLANT
DERMABOND ADVANCED (GAUZE/BANDAGES/DRESSINGS) ×2
DERMABOND ADVANCED .7 DNX12 (GAUZE/BANDAGES/DRESSINGS) ×2 IMPLANT
DRSG COVADERM PLUS 2X2 (GAUZE/BANDAGES/DRESSINGS) ×4 IMPLANT
DRSG OPSITE POSTOP 3X4 (GAUZE/BANDAGES/DRESSINGS) ×4 IMPLANT
DURAPREP 26ML APPLICATOR (WOUND CARE) ×4 IMPLANT
ELECT REM PT RETURN 9FT ADLT (ELECTROSURGICAL) ×4
ELECTRODE REM PT RTRN 9FT ADLT (ELECTROSURGICAL) ×2 IMPLANT
GLOVE BIO SURGEON STRL SZ7 (GLOVE) ×16 IMPLANT
GLOVE BIOGEL PI IND STRL 6.5 (GLOVE) ×2 IMPLANT
GLOVE BIOGEL PI IND STRL 7.0 (GLOVE) ×4 IMPLANT
GLOVE BIOGEL PI IND STRL 7.5 (GLOVE) ×6 IMPLANT
GLOVE BIOGEL PI INDICATOR 6.5 (GLOVE) ×2
GLOVE BIOGEL PI INDICATOR 7.0 (GLOVE) ×4
GLOVE BIOGEL PI INDICATOR 7.5 (GLOVE) ×6
GOWN STRL REUS W/ TWL XL LVL3 (GOWN DISPOSABLE) IMPLANT
GOWN STRL REUS W/TWL LRG LVL3 (GOWN DISPOSABLE) ×12 IMPLANT
GOWN STRL REUS W/TWL XL LVL3 (GOWN DISPOSABLE) ×4 IMPLANT
HOLDER FOLEY CATH W/STRAP (MISCELLANEOUS) ×4 IMPLANT
KIT RM TURNOVER CYSTO AR (KITS) ×4 IMPLANT
LEGGING LITHOTOMY PAIR STRL (DRAPES) ×4 IMPLANT
MANIFOLD NEPTUNE II (INSTRUMENTS) IMPLANT
NEEDLE INSUFFLATION 14GA 120MM (NEEDLE) IMPLANT
NS IRRIG 500ML POUR BTL (IV SOLUTION) ×4 IMPLANT
PACK LAVH (CUSTOM PROCEDURE TRAY) ×4 IMPLANT
PACK ROBOTIC GOWN (GOWN DISPOSABLE) IMPLANT
PACK TRENDGUARD 450 HYBRID PRO (MISCELLANEOUS) IMPLANT
PACK TRENDGUARD 600 HYBRD PROC (MISCELLANEOUS) IMPLANT
PAD OB MATERNITY 4.3X12.25 (PERSONAL CARE ITEMS) ×4 IMPLANT
PAD PREP 24X48 CUFFED NSTRL (MISCELLANEOUS) ×4 IMPLANT
POUCH SPECIMEN RETRIEVAL 10MM (ENDOMECHANICALS) IMPLANT
SCISSORS LAP 5X35 DISP (ENDOMECHANICALS) IMPLANT
SCISSORS LAP 5X45 EPIX DISP (ENDOMECHANICALS) IMPLANT
SEALER TISSUE G2 CVD JAW 45CM (ENDOMECHANICALS) ×4 IMPLANT
SET IRRIG TUBING LAPAROSCOPIC (IRRIGATION / IRRIGATOR) ×4 IMPLANT
SET IRRIG Y TYPE TUR BLADDER L (SET/KITS/TRAYS/PACK) ×4 IMPLANT
SOLUTION ELECTROLUBE (MISCELLANEOUS) IMPLANT
STRIP CLOSURE SKIN 1/4X4 (GAUZE/BANDAGES/DRESSINGS) IMPLANT
SUT MNCRL AB 4-0 PS2 18 (SUTURE) ×4 IMPLANT
SUT VIC AB 0 CT1 18XCR BRD8 (SUTURE) ×4 IMPLANT
SUT VIC AB 0 CT1 36 (SUTURE) ×8 IMPLANT
SUT VIC AB 0 CT1 8-18 (SUTURE) ×4
SUT VIC AB 2-0 CT1 (SUTURE) IMPLANT
SUT VIC AB 2-0 SH 27 (SUTURE)
SUT VIC AB 2-0 SH 27XBRD (SUTURE) IMPLANT
SUT VIC AB 3-0 SH 27 (SUTURE)
SUT VIC AB 3-0 SH 27X BRD (SUTURE) IMPLANT
SUT VICRYL 0 UR6 27IN ABS (SUTURE) IMPLANT
SUT VICRYL 1 TIES 12X18 (SUTURE) ×4 IMPLANT
SUT VICRYL 4-0 PS2 18IN ABS (SUTURE) ×4 IMPLANT
SYR BULB IRRIGATION 50ML (SYRINGE) IMPLANT
TOWEL OR 17X24 6PK STRL BLUE (TOWEL DISPOSABLE) ×8 IMPLANT
TRAY FOLEY CATH SILVER 14FR (SET/KITS/TRAYS/PACK) ×4 IMPLANT
TRENDGUARD 450 HYBRID PRO PACK (MISCELLANEOUS)
TRENDGUARD 600 HYBRID PROC PK (MISCELLANEOUS)
TROCAR BALLN 12MMX100 BLUNT (TROCAR) IMPLANT
TROCAR BLADELESS OPT 12M 100M (ENDOMECHANICALS) IMPLANT
TROCAR OPTI TIP 5M 100M (ENDOMECHANICALS) ×4 IMPLANT
TROCAR XCEL DIL TIP R 11M (ENDOMECHANICALS) ×4 IMPLANT
TUBE CONNECTING 12'X1/4 (SUCTIONS)
TUBE CONNECTING 12X1/4 (SUCTIONS) IMPLANT
TUBING INSUF HEATED (TUBING) ×4 IMPLANT
WARMER LAPAROSCOPE (MISCELLANEOUS) ×4 IMPLANT
WATER STERILE IRR 3000ML UROMA (IV SOLUTION) ×4 IMPLANT
WATER STERILE IRR 500ML POUR (IV SOLUTION) ×4 IMPLANT

## 2017-03-14 NOTE — Anesthesia Postprocedure Evaluation (Signed)
Anesthesia Post Note  Patient: Laura Rios  Procedure(s) Performed: LAPAROSCOPIC ASSISTED VAGINAL HYSTERECTOMY WITH SALPINGO OOPHORECTOMY (Bilateral Abdomen) CYSTOSCOPY (N/A Bladder)     Patient location during evaluation: PACU Anesthesia Type: General Level of consciousness: awake and alert Pain management: pain level controlled Vital Signs Assessment: post-procedure vital signs reviewed and stable Respiratory status: spontaneous breathing, nonlabored ventilation, respiratory function stable and patient connected to nasal cannula oxygen Cardiovascular status: blood pressure returned to baseline and stable Postop Assessment: no apparent nausea or vomiting Anesthetic complications: no    Last Vitals:  Vitals:   03/14/17 0915 03/14/17 0930  BP: 119/71 108/67  Pulse: (!) 54 (!) 50  Resp: 12 11  Temp:    SpO2: 100% 100%    Last Pain:  Vitals:   03/14/17 0629  TempSrc:   PainSc: 0-No pain                 Cathaleen Korol S

## 2017-03-14 NOTE — Transfer of Care (Signed)
Immediate Anesthesia Transfer of Care Note  Patient: Laura Rios  Procedure(s) Performed: LAPAROSCOPIC ASSISTED VAGINAL HYSTERECTOMY WITH SALPINGO OOPHORECTOMY (Bilateral Abdomen) CYSTOSCOPY (N/A Bladder)  Patient Location: PACU  Anesthesia Type:General  Level of Consciousness: awake, alert , oriented and patient cooperative  Airway & Oxygen Therapy: Patient Spontanous Breathing and Patient connected to nasal cannula oxygen  Post-op Assessment: Report given to RN and Post -op Vital signs reviewed and stable  Post vital signs: Reviewed and stable  Last Vitals:  Vitals:   03/14/17 0545  BP: (!) 155/86  Pulse: 71  Resp: 16  Temp: 36.7 C  SpO2: 100%    Last Pain:  Vitals:   03/14/17 0629  TempSrc:   PainSc: 0-No pain      Patients Stated Pain Goal: 8 (13/64/38 3779)  Complications: No apparent anesthesia complications

## 2017-03-14 NOTE — Anesthesia Procedure Notes (Signed)
Procedure Name: Intubation Date/Time: 03/14/2017 7:29 AM Performed by: Wanita Chamberlain Pre-anesthesia Checklist: Patient identified, Emergency Drugs available, Suction available, Patient being monitored and Timeout performed Patient Re-evaluated:Patient Re-evaluated prior to induction Oxygen Delivery Method: Circle system utilized Preoxygenation: Pre-oxygenation with 100% oxygen Induction Type: IV induction Ventilation: Mask ventilation without difficulty Laryngoscope Size: Mac and 3 Grade View: Grade I Tube type: Oral Number of attempts: 1 Airway Equipment and Method: Stylet Placement Confirmation: ETT inserted through vocal cords under direct vision,  positive ETCO2 and breath sounds checked- equal and bilateral Secured at: 21 cm Tube secured with: Tape Dental Injury: Teeth and Oropharynx as per pre-operative assessment

## 2017-03-14 NOTE — Brief Op Note (Signed)
Patient name  Laura Rios, [place] DICTATION# [id number] CSN# [id number]  [facility], MD 03/14/2017 8:51 AM

## 2017-03-14 NOTE — Progress Notes (Signed)
Patient ID: Laura Rios, female   DOB: 23-Aug-1955, 61 y.o.   MRN: [id number] AF VSS ABD SOFT DRESSINGS DRY GOOD UO  MINIMAL BLEEDING

## 2017-03-14 NOTE — Op Note (Signed)
NAMEJOYCE, HEITMAN NO.:  [id number]  MEDICAL RECORD NO.:  [id number]  LOCATION:                                 FACILITY:  PHYSICIAN:  Darlyn Chamber, M.D.        DATE OF BIRTH:  DATE OF PROCEDURE:  03/14/2017 DATE OF DISCHARGE:                              OPERATIVE REPORT   PREOPERATIVE DIAGNOSIS:  Persistent cervical dysplasia consistent with high-grade lesion.  POSTOPERATIVE DIAGNOSIS:  Persistent cervical dysplasia consistent with high-grade lesion.  OPERATIVE PROCEDURE:  Laparoscopic-assisted vaginal hysterectomy, bilateral salpingo-oophorectomy, and cystoscopy.  SURGEON:  Darlyn Chamber, M.D.  ASSISTANT:  Dr. Alfred Levins.  ANESTHESIA:  General endotracheal.  ESTIMATED BLOOD LOSS:  100 mL.  PACKS:  None.  DRAINS:  Urethral Foley.  INTRAOPERATIVE BLOOD PLACED:  None.  COMPLICATIONS:  None.  INDICATION:  Dictated in history and physical.  PROCEDURE IN DETAIL:  The patient was taken to the OR and placed in supine position.  After satisfactory level of general endotracheal anesthesia was obtained, the patient was placed in dorsal lithotomy position using Allen stirrups.  Perineum and vagina were prepped out with Betadine.  A speculum was placed in the vaginal vault.  Cervix was grasped with Ardis Hughs tenaculum.  A Hulka tenaculum was put in place and secured.  Bladder was emptied by in-and-out catheterization.  The abdomen was prepped with DuraPrep.  After had a persistent time to dry, the patient was then draped in sterile field.  Subumbilical incision was made with knife.  Veress needle was introduced into abdominal cavity.  I placed approximately 2.5 L of carbon dioxide.  A 10/11 trocar was inserted.  Laparoscope was introduced.  Visualization revealed no evidence of injury to adjacent organs.  A 5-mm trocar was put in place in suprapubic area under direct visualization.  Visualization showed a normal appendix.  Both lateral gutters were  cleared.  Upper abdomen including liver, tip of the gallbladder was clear.  Uterus was normal size and shape.  Tubes and ovaries were unremarkable.  First, we went to the right side.  The right ureter was identified.  The right tube and ovary were elevated.  The right ovarian vasculature was cauterized and incised using the EnSeal.  We continued cautery incision of the mesenteric attachments up to the right round ligament.  The right round ligament was then cauterized and incised.  We then went to the left side.  The left tube and ovary were elevated.  The left ureter was identified.  The left ovarian vasculature was cauterized and incised using the EnSeal.  The nose and tender attachments to the ovary were cauterized, incised up to the round ligament.  The round ligament was then cauterized and incised.  We had good hemostasis.  The abdomen was deflated with carbon dioxide and laparoscope was removed.  The patient's legs were repositioned.  The Hulka tenaculum was then removed.  A weighted speculum was placed in the vaginal vault.  The cervix was grasped with a Ardis Hughs tenaculum.  Cul-de-sac was entered sharply.  Both uterosacral ligaments were clamped, cut, and suture ligated with 0-Vicryl.  The reflection of vaginal mucosa anteriorly was incised.  The bladder was dissected superiorly.  Paracervical tissue was clamped, cut, and suture ligated with 0 Vicryl.  Using the clamp cut and tie technique with suture ligatures of 0 Vicryl, the parametrium was serially separated from sides of uterus.  We identified the vesicouterine space and entered it.  The retractor was put in place to retract the bladder superiorly.  At this point in time, the uterus was flipped.  Remainder of the pedicles were clamped and cut.  Uterus, tubes, and ovaries were passed off the operative field.  Held pedicles were secured with free tie of 0 Vicryl.  Uterosacral plication stitch of 0 Vicryl was put in place.  At  this point in time, the vaginal mucosa was reapproximated with interrupted figure-of-eights of 0 Vicryl.  We had good hemostasis.  The patient's abdomen was re-inflated with carbon dioxide and laparoscope was introduced.  Areas of oozing were brought under control using the EnSeal.  We thoroughly irrigated the pelvis.  After that, we had no active bleeding.  We deflated the abdomen and reinflated.  No active bleeding encountered.  The abdomen was deflated with carbon dioxide.  All trocars were removed.  Subumbilical incision closed with interrupted subcuticulars of 4-0 Vicryl.  Suprapubic incision was closed with Dermabond.  The patient's legs were repositioned.  Cystoscope was introduced. Irrigation was placed in the bladder.  Visualization revealed no evidence of injury to bladder.  Both ureters were easily identified. Jets of the clear urine were noted to be coming from both ureteral orifices multiple times.  At this point in time, the cystoscope was removed.  Foley was placed to straight drain.  The patient taken out of the dorsal lithotomy position.  Once alert and extubated, transferred to the recovery room in good condition.  Sponge, instrument, and needle count were reported as correct by circulating nurse multiple times.     Darlyn Chamber, M.D.     JSM/MEDQ  D:  03/14/2017  T:  03/14/2017  Job:  [id number]

## 2017-03-14 NOTE — Brief Op Note (Deleted)
  The note originally documented on this encounter has been moved the the encounter in which it belongs.  

## 2017-03-14 NOTE — Op Note (Deleted)
  The note originally documented on this encounter has been moved the the encounter in which it belongs.  

## 2017-03-14 NOTE — Progress Notes (Signed)
Dr. Radene Knee gave verbal order to remove foley tonight or in the morning.

## 2017-03-14 NOTE — H&P (Signed)
  History and physical exam unchanged 

## 2017-03-15 ENCOUNTER — Encounter (HOSPITAL_BASED_OUTPATIENT_CLINIC_OR_DEPARTMENT_OTHER): Payer: Self-pay | Admitting: Obstetrics and Gynecology

## 2017-03-15 DIAGNOSIS — N888 Other specified noninflammatory disorders of cervix uteri: Secondary | ICD-10-CM | POA: Diagnosis not present

## 2017-03-15 LAB — CBC
HEMATOCRIT: 32.5 % — AB (ref 36.0–46.0)
Hemoglobin: 11.3 g/dL — ABNORMAL LOW (ref 12.0–15.0)
MCH: 31.9 pg (ref 26.0–34.0)
MCHC: 34.8 g/dL (ref 30.0–36.0)
MCV: 91.8 fL (ref 78.0–100.0)
Platelets: 216 10*3/uL (ref 150–400)
RBC: 3.54 MIL/uL — ABNORMAL LOW (ref 3.87–5.11)
RDW: 12.4 % (ref 11.5–15.5)
WBC: 10.5 10*3/uL (ref 4.0–10.5)

## 2017-03-15 MED ORDER — OXYCODONE-ACETAMINOPHEN 7.5-325 MG PO TABS: 1.0000 | ORAL_TABLET | ORAL | 0 refills | Status: DC | PRN

## 2017-03-15 NOTE — Progress Notes (Signed)
1 Day Post-Op Procedure(s) (LRB): LAPAROSCOPIC ASSISTED VAGINAL HYSTERECTOMY WITH SALPINGO OOPHORECTOMY (Bilateral) CYSTOSCOPY (N/A)  Subjective: Patient reports tolerating PO and no problems voiding.    Objective: I have reviewed patient's vital signs, intake and output and labs.  General: alert GI: soft, non-tender; bowel sounds normal; no masses,  no organomegaly Vaginal Bleeding: minimal  Assessment: s/p Procedure(s) with comments: LAPAROSCOPIC ASSISTED VAGINAL HYSTERECTOMY WITH SALPINGO OOPHORECTOMY (Bilateral) - need bed CYSTOSCOPY (N/A): stable  Plan: Discharge home  LOS: 0 days    Laura Rios S 03/15/2017, 8:30 AM

## 2017-03-15 NOTE — Progress Notes (Signed)
Time incorrect/error- teaching done at 0850, 03/15/17. Blair Hailey, RN

## 2017-03-15 NOTE — Discharge Summary (Signed)
Patient name  Laura Rios, Laura Rios DICTATION# [id number] CSN# [id number]  [facility], MD 03/15/2017 8:34 AM

## 2017-03-16 NOTE — Discharge Summary (Signed)
NAMENANCE, MCCOMBS                 ACCOUNT NO.:  [id number]  MEDICAL RECORD NO.:  [id number]  LOCATION:                                 FACILITY:  PHYSICIAN:  Darlyn Chamber, M.D.        DATE OF BIRTH:  DATE OF ADMISSION:  03/14/2017 DATE OF DISCHARGE:  03/15/2017                              DISCHARGE SUMMARY   ADMITTING DIAGNOSIS:  Persistent cervical dysplasia.  DISCHARGE DIAGNOSIS:  Persistent cervical dysplasia.  OPERATIVE PROCEDURES: 1. Laparoscopic-assisted vaginal hysterectomy. 2. Bilateral salpingo-oophorectomy. 3. Cystoscopy.  For complete history and physical, please see dictated note.  COURSE IN THE HOSPITAL:  The patient underwent above-noted surgery. Postoperatively, she did extremely well.  Her postop hemoglobin was 11.3.  On the first postop day, she was tolerating diet.  She was voiding without difficulty.  She had no bleeding.  The incisions were clear and intact.  In terms of complications, none were encountered during her stay in the hospital.  The patient was discharged home in stable condition.  DISPOSITION:  She is to avoid heavy lifting, vaginal entrance, or driving a car.  Instructed to call should there be heavy bleeding, nausea, vomiting, fever, severe pain, or signs and symptoms of deep venous thrombosis or pulmonary embolus.  Discharged home on Percocet as needed for pain.  Will be called in to arrange to be followed in the office in 1 week.     Darlyn Chamber, M.D.     JSM/MEDQ  D:  03/15/2017  T:  03/16/2017  Job:  [id number]

## 2017-03-21 DIAGNOSIS — Z09 Encounter for follow-up examination after completed treatment for conditions other than malignant neoplasm: Secondary | ICD-10-CM | POA: Diagnosis not present

## 2017-07-22 DIAGNOSIS — H2513 Age-related nuclear cataract, bilateral: Secondary | ICD-10-CM | POA: Diagnosis not present

## 2017-07-22 DIAGNOSIS — H40023 Open angle with borderline findings, high risk, bilateral: Secondary | ICD-10-CM | POA: Diagnosis not present

## 2017-09-05 DIAGNOSIS — S30861A Insect bite (nonvenomous) of abdominal wall, initial encounter: Secondary | ICD-10-CM | POA: Diagnosis not present

## 2017-10-14 DIAGNOSIS — E785 Hyperlipidemia, unspecified: Secondary | ICD-10-CM | POA: Diagnosis not present

## 2017-10-14 DIAGNOSIS — I10 Essential (primary) hypertension: Secondary | ICD-10-CM | POA: Diagnosis not present

## 2017-12-10 DIAGNOSIS — L565 Disseminated superficial actinic porokeratosis (DSAP): Secondary | ICD-10-CM | POA: Diagnosis not present

## 2017-12-10 DIAGNOSIS — Z85828 Personal history of other malignant neoplasm of skin: Secondary | ICD-10-CM | POA: Diagnosis not present

## 2017-12-10 DIAGNOSIS — L570 Actinic keratosis: Secondary | ICD-10-CM | POA: Diagnosis not present

## 2017-12-10 DIAGNOSIS — D692 Other nonthrombocytopenic purpura: Secondary | ICD-10-CM | POA: Diagnosis not present

## 2018-01-27 DIAGNOSIS — H40023 Open angle with borderline findings, high risk, bilateral: Secondary | ICD-10-CM | POA: Diagnosis not present

## 2018-01-27 DIAGNOSIS — H2513 Age-related nuclear cataract, bilateral: Secondary | ICD-10-CM | POA: Diagnosis not present

## 2018-03-11 DIAGNOSIS — Z803 Family history of malignant neoplasm of breast: Secondary | ICD-10-CM | POA: Diagnosis not present

## 2018-03-11 DIAGNOSIS — Z1231 Encounter for screening mammogram for malignant neoplasm of breast: Secondary | ICD-10-CM | POA: Diagnosis not present

## 2018-03-13 DIAGNOSIS — Z681 Body mass index (BMI) 19 or less, adult: Secondary | ICD-10-CM | POA: Diagnosis not present

## 2018-03-13 DIAGNOSIS — Z01419 Encounter for gynecological examination (general) (routine) without abnormal findings: Secondary | ICD-10-CM | POA: Diagnosis not present

## 2018-06-12 DIAGNOSIS — Z809 Family history of malignant neoplasm, unspecified: Secondary | ICD-10-CM | POA: Diagnosis not present

## 2018-06-18 DIAGNOSIS — Z23 Encounter for immunization: Secondary | ICD-10-CM | POA: Diagnosis not present

## 2018-06-19 DIAGNOSIS — L821 Other seborrheic keratosis: Secondary | ICD-10-CM | POA: Diagnosis not present

## 2019-01-08 ENCOUNTER — Other Ambulatory Visit: Payer: Self-pay

## 2019-01-08 DIAGNOSIS — Z20822 Contact with and (suspected) exposure to covid-19: Secondary | ICD-10-CM

## 2019-01-10 LAB — NOVEL CORONAVIRUS, NAA: SARS-CoV-2, NAA: NOT DETECTED

## 2020-02-01 ENCOUNTER — Encounter: Payer: Self-pay | Admitting: Physician Assistant

## 2020-02-01 ENCOUNTER — Ambulatory Visit: Payer: Self-pay

## 2020-02-01 ENCOUNTER — Ambulatory Visit: Payer: Medicare Other | Admitting: Physician Assistant

## 2020-02-01 DIAGNOSIS — G8929 Other chronic pain: Secondary | ICD-10-CM

## 2020-02-01 DIAGNOSIS — M5441 Lumbago with sciatica, right side: Secondary | ICD-10-CM

## 2020-02-01 MED ORDER — METHYLPREDNISOLONE 4 MG PO TABS: ORAL_TABLET | ORAL | 0 refills | Status: AC

## 2020-02-01 NOTE — Progress Notes (Signed)
Office Visit Note   Patient: Laura Rios           Date of Birth: November 07, 1955           MRN: [id number] Visit Date: 02/01/2020              Requested by: Arvella Nigh, [address] [place],  [place] [address] PCP: Arvella Nigh, MD   Assessment & Plan: Visit Diagnoses:  1. Chronic bilateral low back pain with right-sided sciatica     Plan: Place her on a Medrol Dosepak.  Send her to formal physical therapy to work on core strengthening, stretching and modalities.  There are also to include a home exercise program.  We will see her back in about 6 weeks if she has no improvement recommend MRI to rule out spinal stenosis as a source of her pain.  Questions were encouraged and answered at length today.  Follow-Up Instructions: Return in about 6 weeks (around 03/14/2020).   Orders:  Orders Placed This Encounter  Procedures  . XR Lumbar Spine 2-3 Views   Meds ordered this encounter  Medications  . methylPREDNISolone (MEDROL) 4 MG tablet    Sig: Take as directed    Dispense:  21 tablet    Refill:  0      Procedures: No procedures performed   Clinical Data: No additional findings.   Subjective: Chief Complaint  Patient presents with  . Lower Back - Pain    HPI Laura Rios is a 64 year old female were seen for the first time for bilateral thigh pain achiness and radiation of pain in with tingling and numbness down into her toes.  She states she cannot stand for long period of time or walk for long periods time due to the radicular pain down the right leg.  Occasionally has some numbness tingling down the left leg but this is rare.  Pain started in July as discomfort in the anterior thigh.  She denies any waking pain, bowel bladder dysfunction or saddle anesthesia like symptoms.  She has tried Tylenol which helps some.  She avoids NSAIDs due to hypertension. Review of Systems   Objective: Vital Signs: There were no vitals taken for this visit.  Physical  Exam Constitutional:      Appearance: She is normal weight. She is not ill-appearing or diaphoretic.  Cardiovascular:     Pulses: Normal pulses.  Pulmonary:     Effort: Pulmonary effort is normal.  Neurological:     Mental Status: She is alert and oriented to person, place, and time.  Psychiatric:        Mood and Affect: Mood normal.     Ortho Exam Lower extremities 5 out of 5 strength throughout against resistance.  Negative straight leg raise bilaterally.  She has full extension lumbar spine without pain.  Full flexion lumbar spine without pain.  Sensation grossly intact throughout both feet to light touch. Specialty Comments:  No specialty comments available.  Imaging: XR Lumbar Spine 2-3 Views  Result Date: 02/01/2020 AP lateral lumbar spine: No acute fracture.  Grade 1 anterior spondylolisthesis L4-L5.  Endplate spurring O9-6.  Scoliosis with convexity to the right. Significant fecal material overlying the lateral lumbar spine but no obvious bony abnormalities.     PMFS History: Patient Active Problem List   Diagnosis Date Noted  . S/P laparoscopic assisted vaginal hysterectomy (LAVH) 03/14/2017  . ESSENTIAL HYPERTENSION, BENIGN 01/10/2010  . NONALLOPATHIC LESION OF LUMBAR REGION NEC 01/10/2010  .  [place] 01/10/2010   Past Medical History:  Diagnosis Date  . Hypertension     History reviewed. No pertinent family history.  Past Surgical History:  Procedure Laterality Date  . CESAREAN SECTION  1985  . CYSTOSCOPY N/A 03/14/2017   Procedure: CYSTOSCOPY;  Surgeon: Arvella Nigh, MD;  Location: [facility];  Service: Gynecology;  Laterality: N/A;  . LAPAROSCOPIC VAGINAL HYSTERECTOMY WITH SALPINGO OOPHORECTOMY Bilateral 03/14/2017   Procedure: LAPAROSCOPIC ASSISTED VAGINAL HYSTERECTOMY WITH SALPINGO OOPHORECTOMY;  Surgeon: Arvella Nigh, MD;  Location: [facility];  Service: Gynecology;  Laterality: Bilateral;   need bed   Social History   Occupational History  . Not on file  Tobacco Use  . Smoking status: Never Smoker  . Smokeless tobacco: Never Used  Vaping Use  . Vaping Use: Never used  Substance and Sexual Activity  . Alcohol use: No  . Drug use: No  . Sexual activity: Yes

## 2020-02-03 ENCOUNTER — Telehealth: Payer: Self-pay | Admitting: Physician Assistant

## 2020-02-03 ENCOUNTER — Encounter: Payer: Self-pay | Admitting: Physician Assistant

## 2020-02-03 DIAGNOSIS — H409 Unspecified glaucoma: Secondary | ICD-10-CM | POA: Insufficient documentation

## 2020-02-03 NOTE — Telephone Encounter (Signed)
Patient called. Says she has glaucoma and was reading the prednisone precautions and it says to check MD before taking. She would like a call back. [phone]

## 2020-02-03 NOTE — Telephone Encounter (Signed)
Please advise 

## 2020-02-03 NOTE — Telephone Encounter (Signed)
noted 

## 2020-02-03 NOTE — Telephone Encounter (Signed)
Called patient she actually saw her ophthalmologist today who is not overly concerned due to the fact that this is a short-term dose.  We will continue the Medrol Dosepak as prescribed.  Also I told her that I would note in her chart that she does have glaucoma as this was not noted before.

## 2020-02-03 NOTE — Telephone Encounter (Signed)
Patient called asking for an update about taking her medication. Please call patient ack as soon as possible. Patient phone number is [phone]

## 2020-02-16 ENCOUNTER — Ambulatory Visit: Payer: Medicare Other | Attending: Internal Medicine

## 2020-02-16 DIAGNOSIS — Z23 Encounter for immunization: Secondary | ICD-10-CM

## 2020-02-16 NOTE — Progress Notes (Signed)
   Covid-19 Vaccination Clinic  Name:  Amit Meloy    MRN: [id number] DOB: 07/04/1955  02/16/2020  Ms. Brocato was observed post Covid-19 immunization for 15 minutes without incident. She was provided with Vaccine Information Sheet and instruction to access the V-Safe system.   Ms. Emmer was instructed to call 911 with any severe reactions post vaccine: Marland Kitchen Difficulty breathing  . Swelling of face and throat  . A fast heartbeat  . A bad rash all over body  . Dizziness and weakness

## 2020-03-14 ENCOUNTER — Ambulatory Visit: Payer: Medicare Other | Admitting: Physician Assistant

## 2020-03-14 ENCOUNTER — Encounter: Payer: Self-pay | Admitting: Physician Assistant

## 2020-03-14 DIAGNOSIS — M5416 Radiculopathy, lumbar region: Secondary | ICD-10-CM

## 2020-03-14 NOTE — Addendum Note (Signed)
Addended by: Robyne Peers on: 03/14/2020 04:17 PM   Modules accepted: Orders

## 2020-03-14 NOTE — Progress Notes (Signed)
Office Visit Note   Patient: Laura Rios           Date of Birth: Apr 06, 1956           MRN: [id number] Visit Date: 03/14/2020              Requested by: Arvella Nigh, [address] [place],  [place] [address] PCP: Arvella Nigh, MD   Assessment & Plan: Visit Diagnoses:  1. Lumbar radicular pain     Plan: Due to patient's failure of conservative treatment with continued radicular symptoms down the right leg recommend MRI of the lumbar spine to rule out spinal stenosis as a source of her radicular symptoms.  She has tried physical therapy and medications without resolution of her radicular symptoms.  She will follow-up after the MRI to go over results and discuss further treatment.  Follow-Up Instructions: Return After MRI.   Orders:  No orders of the defined types were placed in this encounter.  No orders of the defined types were placed in this encounter.     Procedures: No procedures performed   Clinical Data: No additional findings.   Subjective: Chief Complaint  Patient presents with  . Lower Back - Pain    HPI Mrs. Mcelhinny returns today follow-up of her low back pain with radicular symptoms.  She states her back really does not hurt.  Her left leg is fine at this time.  She continues to have uncomfortable achy pain down the right leg into the foot area.  She is notes constant tingling down the right leg.  She does feel that the Dosepak in the physical therapy helped but she is just concerned what is causing this pain.  She states if she stands for long period of time that she has radicular symptoms down the right leg and has to sit down. Review of Systems See HPI otherwise negative  Objective: Vital Signs: There were no vitals taken for this visit.  Physical Exam General: Well-developed well-nourished female no acute distress mood and affect appropriate Ortho Exam Lower extremities negative straight leg raise bilaterally.  She has full flexion  slightly limited extension lumbar spine without pain.  Good range of motion bilateral hips without pain. Specialty Comments:  No specialty comments available.  Imaging: No results found.   PMFS History: Patient Active Problem List   Diagnosis Date Noted  . Glaucoma 02/03/2020  . S/P laparoscopic assisted vaginal hysterectomy (LAVH) 03/14/2017  . ESSENTIAL HYPERTENSION, BENIGN 01/10/2010  . NONALLOPATHIC LESION OF LUMBAR REGION NEC 01/10/2010  . [place] 01/10/2010   Past Medical History:  Diagnosis Date  . Hypertension     History reviewed. No pertinent family history.  Past Surgical History:  Procedure Laterality Date  . CESAREAN SECTION  1985  . CYSTOSCOPY N/A 03/14/2017   Procedure: CYSTOSCOPY;  Surgeon: Arvella Nigh, MD;  Location: [facility];  Service: Gynecology;  Laterality: N/A;  . LAPAROSCOPIC VAGINAL HYSTERECTOMY WITH SALPINGO OOPHORECTOMY Bilateral 03/14/2017   Procedure: LAPAROSCOPIC ASSISTED VAGINAL HYSTERECTOMY WITH SALPINGO OOPHORECTOMY;  Surgeon: Arvella Nigh, MD;  Location: [facility];  Service: Gynecology;  Laterality: Bilateral;  need bed   Social History   Occupational History  . Not on file  Tobacco Use  . Smoking status: Never Smoker  . Smokeless tobacco: Never Used  Vaping Use  . Vaping Use: Never used  Substance and Sexual Activity  . Alcohol use: No  . Drug use: No  .  Sexual activity: Yes

## 2020-04-03 ENCOUNTER — Ambulatory Visit
Admission: RE | Admit: 2020-04-03 | Discharge: 2020-04-03 | Disposition: A | Discharge status: Home / Self Care | Payer: Medicare Other | Source: Ambulatory Visit | Attending: Physician Assistant | Admitting: Physician Assistant

## 2020-04-03 ENCOUNTER — Other Ambulatory Visit: Payer: Self-pay

## 2020-04-03 DIAGNOSIS — M5416 Radiculopathy, lumbar region: Secondary | ICD-10-CM

## 2020-04-06 ENCOUNTER — Encounter: Payer: Self-pay | Admitting: Orthopaedic Surgery

## 2020-04-06 ENCOUNTER — Ambulatory Visit: Payer: Medicare Other | Admitting: Orthopaedic Surgery

## 2020-04-06 DIAGNOSIS — M5416 Radiculopathy, lumbar region: Secondary | ICD-10-CM

## 2020-04-06 DIAGNOSIS — M5441 Lumbago with sciatica, right side: Secondary | ICD-10-CM

## 2020-04-06 DIAGNOSIS — G8929 Other chronic pain: Secondary | ICD-10-CM | POA: Diagnosis not present

## 2020-04-06 NOTE — Progress Notes (Signed)
The patient is a very active and petite 63 year old female who comes in for follow-up after having a MRI of her lumbar spine.  She does not have a lot of back pain but she does have radicular symptoms going down her right thigh past her knee and into her foot.  She is not a diabetic.  This become aggravating for her but not debilitating.  It was time to obtain an MRI after third conservative treatment including a short course of physical therapy.  On exam she mobilizes easily.  She has good range of motion of her right hip and knee with minimal positive straight leg raise.  There is no a lot of pain to palpation of the lumbar spine.  The MRI does show at L2-L3 there is a left disc protrusion as well as L3-L4 but there is more of a rightward protrusion at L5-S1.  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  I did share with her her lumbar spine MRI and showed her a model of the lumbar spine.  At certain levels of the lumbar spine she does have enough findings that could be contributing to the radicular symptoms.  She would now like to try outpatient physical therapy again with now focused efforts on her back and try to dissipate the radicular symptoms.  I did talk to her about considering an epidural steroid injection at some point if she fails conservative treatment.  Right now she is not having debilitating pain or weakness to warrant a surgical referral.  All question concerns were answered and addressed.  We will see her back in about 6 weeks after course of therapy.

## 2020-05-12 ENCOUNTER — Other Ambulatory Visit: Payer: Medicare Other

## 2020-05-12 DIAGNOSIS — Z20822 Contact with and (suspected) exposure to covid-19: Secondary | ICD-10-CM

## 2020-05-15 LAB — NOVEL CORONAVIRUS, NAA: SARS-CoV-2, NAA: NOT DETECTED

## 2020-05-15 LAB — SARS-COV-2, NAA 2 DAY TAT

## 2020-05-18 ENCOUNTER — Ambulatory Visit: Payer: Medicare Other | Admitting: Orthopaedic Surgery

## 2020-05-18 ENCOUNTER — Encounter: Payer: Self-pay | Admitting: Orthopaedic Surgery

## 2020-05-18 DIAGNOSIS — M5416 Radiculopathy, lumbar region: Secondary | ICD-10-CM | POA: Diagnosis not present

## 2020-05-18 NOTE — Progress Notes (Signed)
The patient is a 65 year old female who is thin and very active.  She is been going through active physical therapy for her lumbar spine.  She has some chronic low back pain that is somewhat better but definitely not worse.  She occasionally has radicular symptoms going down her leg on the right side.  More of her symptoms of her right knee around the IT band area laterally.  She denies any swelling.  She wants to be active and be able to take long walks and exercise without having back pain.  She is a very thin individual.  She has a normal exam in terms of her bilateral lower extremities especially her right knee other than lateral pain which is consistent with more of a structural issue such as IT band.  I have recommended Voltaren gel for her knee and she can try this for her back  Based on her clinical exam and signs and symptoms, I do not feel that she needs an intervention for her spine as of yet in terms of the facet joint injection or epidural steroid injection.  However, if her symptoms worsen anyway she knows to let us know because that would be my next step.  All questions and concerns were answered and addressed.

## 2020-06-06 ENCOUNTER — Telehealth: Payer: Self-pay | Admitting: Physician Assistant

## 2020-06-06 NOTE — Telephone Encounter (Signed)
Received vm from patient wanting to get records & MRI to take to [facility] for 2nd op. IC,lmvm advised need to sign release and would need to get MRI images from [facility].

## 2020-06-08 ENCOUNTER — Telehealth: Payer: Self-pay | Admitting: Orthopaedic Surgery

## 2020-06-08 NOTE — Telephone Encounter (Signed)
Received medical release form from patient

## 2020-06-08 NOTE — Telephone Encounter (Signed)
Called patient left message to call back with name of company records are going to be faxed to.

## 2021-03-22 DIAGNOSIS — Z6821 Body mass index (BMI) 21.0-21.9, adult: Secondary | ICD-10-CM | POA: Diagnosis not present

## 2021-03-22 DIAGNOSIS — Z779 Other contact with and (suspected) exposures hazardous to health: Secondary | ICD-10-CM | POA: Diagnosis not present

## 2021-04-12 DIAGNOSIS — H2513 Age-related nuclear cataract, bilateral: Secondary | ICD-10-CM | POA: Diagnosis not present

## 2021-04-12 DIAGNOSIS — H40023 Open angle with borderline findings, high risk, bilateral: Secondary | ICD-10-CM | POA: Diagnosis not present

## 2021-04-12 DIAGNOSIS — H40033 Anatomical narrow angle, bilateral: Secondary | ICD-10-CM | POA: Diagnosis not present

## 2021-04-17 DIAGNOSIS — M545 Low back pain, unspecified: Secondary | ICD-10-CM | POA: Diagnosis not present

## 2021-04-26 DIAGNOSIS — Z1231 Encounter for screening mammogram for malignant neoplasm of breast: Secondary | ICD-10-CM | POA: Diagnosis not present

## 2021-05-24 DIAGNOSIS — H40023 Open angle with borderline findings, high risk, bilateral: Secondary | ICD-10-CM | POA: Diagnosis not present

## 2021-05-24 DIAGNOSIS — H40033 Anatomical narrow angle, bilateral: Secondary | ICD-10-CM | POA: Diagnosis not present

## 2021-06-19 ENCOUNTER — Other Ambulatory Visit (HOSPITAL_BASED_OUTPATIENT_CLINIC_OR_DEPARTMENT_OTHER): Payer: Self-pay | Admitting: Family Medicine

## 2021-06-19 ENCOUNTER — Ambulatory Visit (HOSPITAL_BASED_OUTPATIENT_CLINIC_OR_DEPARTMENT_OTHER)
Admission: RE | Admit: 2021-06-19 | Discharge: 2021-06-19 | Disposition: A | Discharge status: Home / Self Care | Payer: Medicare Other | Source: Ambulatory Visit | Attending: Family Medicine | Admitting: Family Medicine

## 2021-06-19 ENCOUNTER — Other Ambulatory Visit: Payer: Self-pay

## 2021-06-19 DIAGNOSIS — M79644 Pain in right finger(s): Secondary | ICD-10-CM

## 2021-06-19 DIAGNOSIS — M19041 Primary osteoarthritis, right hand: Secondary | ICD-10-CM | POA: Diagnosis not present

## 2021-07-05 DIAGNOSIS — M25641 Stiffness of right hand, not elsewhere classified: Secondary | ICD-10-CM | POA: Diagnosis not present

## 2021-07-05 DIAGNOSIS — M79644 Pain in right finger(s): Secondary | ICD-10-CM | POA: Diagnosis not present

## 2021-07-19 DIAGNOSIS — M25641 Stiffness of right hand, not elsewhere classified: Secondary | ICD-10-CM | POA: Diagnosis not present

## 2021-07-19 DIAGNOSIS — M20011 Mallet finger of right finger(s): Secondary | ICD-10-CM | POA: Diagnosis not present

## 2021-07-19 DIAGNOSIS — M79644 Pain in right finger(s): Secondary | ICD-10-CM | POA: Diagnosis not present

## 2021-08-02 DIAGNOSIS — M20011 Mallet finger of right finger(s): Secondary | ICD-10-CM | POA: Diagnosis not present

## 2021-08-02 DIAGNOSIS — M79644 Pain in right finger(s): Secondary | ICD-10-CM | POA: Diagnosis not present

## 2021-08-02 DIAGNOSIS — M25641 Stiffness of right hand, not elsewhere classified: Secondary | ICD-10-CM | POA: Diagnosis not present

## 2021-08-10 DIAGNOSIS — M79644 Pain in right finger(s): Secondary | ICD-10-CM | POA: Diagnosis not present

## 2021-08-10 DIAGNOSIS — M20011 Mallet finger of right finger(s): Secondary | ICD-10-CM | POA: Diagnosis not present

## 2021-08-10 DIAGNOSIS — M25641 Stiffness of right hand, not elsewhere classified: Secondary | ICD-10-CM | POA: Diagnosis not present

## 2021-08-24 DIAGNOSIS — M20011 Mallet finger of right finger(s): Secondary | ICD-10-CM | POA: Diagnosis not present

## 2021-08-24 DIAGNOSIS — M25641 Stiffness of right hand, not elsewhere classified: Secondary | ICD-10-CM | POA: Diagnosis not present

## 2021-08-24 DIAGNOSIS — M79644 Pain in right finger(s): Secondary | ICD-10-CM | POA: Diagnosis not present

## 2021-09-04 DIAGNOSIS — M20011 Mallet finger of right finger(s): Secondary | ICD-10-CM | POA: Diagnosis not present

## 2021-09-04 DIAGNOSIS — M25641 Stiffness of right hand, not elsewhere classified: Secondary | ICD-10-CM | POA: Diagnosis not present

## 2021-09-04 DIAGNOSIS — M79644 Pain in right finger(s): Secondary | ICD-10-CM | POA: Diagnosis not present

## 2021-09-20 DIAGNOSIS — M20011 Mallet finger of right finger(s): Secondary | ICD-10-CM | POA: Diagnosis not present

## 2021-09-20 DIAGNOSIS — M25641 Stiffness of right hand, not elsewhere classified: Secondary | ICD-10-CM | POA: Diagnosis not present

## 2021-09-20 DIAGNOSIS — M79644 Pain in right finger(s): Secondary | ICD-10-CM | POA: Diagnosis not present

## 2021-10-04 DIAGNOSIS — M79644 Pain in right finger(s): Secondary | ICD-10-CM | POA: Diagnosis not present

## 2021-10-04 DIAGNOSIS — M25641 Stiffness of right hand, not elsewhere classified: Secondary | ICD-10-CM | POA: Diagnosis not present

## 2021-10-04 DIAGNOSIS — M20011 Mallet finger of right finger(s): Secondary | ICD-10-CM | POA: Diagnosis not present

## 2021-11-06 DIAGNOSIS — M20011 Mallet finger of right finger(s): Secondary | ICD-10-CM | POA: Diagnosis not present

## 2021-11-29 DIAGNOSIS — H40033 Anatomical narrow angle, bilateral: Secondary | ICD-10-CM | POA: Diagnosis not present

## 2021-11-29 DIAGNOSIS — H40023 Open angle with borderline findings, high risk, bilateral: Secondary | ICD-10-CM | POA: Diagnosis not present

## 2022-01-03 DIAGNOSIS — L570 Actinic keratosis: Secondary | ICD-10-CM | POA: Diagnosis not present

## 2022-01-03 DIAGNOSIS — L821 Other seborrheic keratosis: Secondary | ICD-10-CM | POA: Diagnosis not present

## 2022-01-03 DIAGNOSIS — D1801 Hemangioma of skin and subcutaneous tissue: Secondary | ICD-10-CM | POA: Diagnosis not present

## 2022-01-03 DIAGNOSIS — D692 Other nonthrombocytopenic purpura: Secondary | ICD-10-CM | POA: Diagnosis not present

## 2022-01-03 DIAGNOSIS — Z85828 Personal history of other malignant neoplasm of skin: Secondary | ICD-10-CM | POA: Diagnosis not present

## 2022-02-12 DIAGNOSIS — M810 Age-related osteoporosis without current pathological fracture: Secondary | ICD-10-CM | POA: Diagnosis not present

## 2022-02-12 DIAGNOSIS — E785 Hyperlipidemia, unspecified: Secondary | ICD-10-CM | POA: Diagnosis not present

## 2022-02-12 DIAGNOSIS — Z0000 Encounter for general adult medical examination without abnormal findings: Secondary | ICD-10-CM | POA: Diagnosis not present

## 2022-02-12 DIAGNOSIS — I10 Essential (primary) hypertension: Secondary | ICD-10-CM | POA: Diagnosis not present

## 2022-02-12 DIAGNOSIS — M545 Low back pain, unspecified: Secondary | ICD-10-CM | POA: Diagnosis not present

## 2022-02-12 DIAGNOSIS — Z23 Encounter for immunization: Secondary | ICD-10-CM | POA: Diagnosis not present

## 2022-02-19 ENCOUNTER — Other Ambulatory Visit (HOSPITAL_COMMUNITY): Payer: Self-pay | Admitting: Family Medicine

## 2022-02-19 ENCOUNTER — Other Ambulatory Visit (HOSPITAL_BASED_OUTPATIENT_CLINIC_OR_DEPARTMENT_OTHER): Payer: Self-pay | Admitting: Family Medicine

## 2022-02-19 DIAGNOSIS — E785 Hyperlipidemia, unspecified: Secondary | ICD-10-CM

## 2022-02-19 DIAGNOSIS — E7800 Pure hypercholesterolemia, unspecified: Secondary | ICD-10-CM

## 2022-02-27 DIAGNOSIS — L565 Disseminated superficial actinic porokeratosis (DSAP): Secondary | ICD-10-CM | POA: Diagnosis not present

## 2022-02-27 DIAGNOSIS — L570 Actinic keratosis: Secondary | ICD-10-CM | POA: Diagnosis not present

## 2022-02-27 DIAGNOSIS — L821 Other seborrheic keratosis: Secondary | ICD-10-CM | POA: Diagnosis not present

## 2022-03-27 DIAGNOSIS — Z6821 Body mass index (BMI) 21.0-21.9, adult: Secondary | ICD-10-CM | POA: Diagnosis not present

## 2022-03-27 DIAGNOSIS — M816 Localized osteoporosis [Lequesne]: Secondary | ICD-10-CM | POA: Diagnosis not present

## 2022-04-03 ENCOUNTER — Ambulatory Visit (HOSPITAL_BASED_OUTPATIENT_CLINIC_OR_DEPARTMENT_OTHER)
Admission: RE | Admit: 2022-04-03 | Discharge: 2022-04-03 | Disposition: A | Discharge status: Home / Self Care | Payer: Medicare Other | Source: Ambulatory Visit | Attending: Family Medicine | Admitting: Family Medicine

## 2022-04-03 DIAGNOSIS — E785 Hyperlipidemia, unspecified: Secondary | ICD-10-CM | POA: Insufficient documentation

## 2022-04-03 DIAGNOSIS — E7800 Pure hypercholesterolemia, unspecified: Secondary | ICD-10-CM | POA: Insufficient documentation

## 2022-04-27 DIAGNOSIS — Z1231 Encounter for screening mammogram for malignant neoplasm of breast: Secondary | ICD-10-CM | POA: Diagnosis not present

## 2022-06-04 DIAGNOSIS — H25813 Combined forms of age-related cataract, bilateral: Secondary | ICD-10-CM | POA: Diagnosis not present

## 2022-06-04 DIAGNOSIS — H40033 Anatomical narrow angle, bilateral: Secondary | ICD-10-CM | POA: Diagnosis not present

## 2022-06-04 DIAGNOSIS — H40023 Open angle with borderline findings, high risk, bilateral: Secondary | ICD-10-CM | POA: Diagnosis not present

## 2022-08-21 DIAGNOSIS — M545 Low back pain, unspecified: Secondary | ICD-10-CM | POA: Diagnosis not present

## 2022-08-21 DIAGNOSIS — M79669 Pain in unspecified lower leg: Secondary | ICD-10-CM | POA: Diagnosis not present

## 2022-08-27 DIAGNOSIS — M79669 Pain in unspecified lower leg: Secondary | ICD-10-CM | POA: Diagnosis not present

## 2022-08-27 DIAGNOSIS — M545 Low back pain, unspecified: Secondary | ICD-10-CM | POA: Diagnosis not present

## 2022-09-03 DIAGNOSIS — M79669 Pain in unspecified lower leg: Secondary | ICD-10-CM | POA: Diagnosis not present

## 2022-09-03 DIAGNOSIS — M545 Low back pain, unspecified: Secondary | ICD-10-CM | POA: Diagnosis not present

## 2022-09-24 DIAGNOSIS — M79669 Pain in unspecified lower leg: Secondary | ICD-10-CM | POA: Diagnosis not present

## 2022-09-24 DIAGNOSIS — M545 Low back pain, unspecified: Secondary | ICD-10-CM | POA: Diagnosis not present

## 2022-10-02 DIAGNOSIS — M4316 Spondylolisthesis, lumbar region: Secondary | ICD-10-CM | POA: Diagnosis not present

## 2022-10-02 DIAGNOSIS — M48062 Spinal stenosis, lumbar region with neurogenic claudication: Secondary | ICD-10-CM | POA: Diagnosis not present

## 2022-10-31 DIAGNOSIS — M79669 Pain in unspecified lower leg: Secondary | ICD-10-CM | POA: Diagnosis not present

## 2022-10-31 DIAGNOSIS — M545 Low back pain, unspecified: Secondary | ICD-10-CM | POA: Diagnosis not present

## 2022-11-28 DIAGNOSIS — M545 Low back pain, unspecified: Secondary | ICD-10-CM | POA: Diagnosis not present

## 2022-11-28 DIAGNOSIS — M79669 Pain in unspecified lower leg: Secondary | ICD-10-CM | POA: Diagnosis not present

## 2022-12-12 DIAGNOSIS — H40033 Anatomical narrow angle, bilateral: Secondary | ICD-10-CM | POA: Diagnosis not present

## 2022-12-12 DIAGNOSIS — H40023 Open angle with borderline findings, high risk, bilateral: Secondary | ICD-10-CM | POA: Diagnosis not present

## 2022-12-12 DIAGNOSIS — H25813 Combined forms of age-related cataract, bilateral: Secondary | ICD-10-CM | POA: Diagnosis not present

## 2023-01-07 DIAGNOSIS — D225 Melanocytic nevi of trunk: Secondary | ICD-10-CM | POA: Diagnosis not present

## 2023-01-07 DIAGNOSIS — L821 Other seborrheic keratosis: Secondary | ICD-10-CM | POA: Diagnosis not present

## 2023-01-07 DIAGNOSIS — Z85828 Personal history of other malignant neoplasm of skin: Secondary | ICD-10-CM | POA: Diagnosis not present

## 2023-01-07 DIAGNOSIS — D2272 Melanocytic nevi of left lower limb, including hip: Secondary | ICD-10-CM | POA: Diagnosis not present

## 2023-01-07 DIAGNOSIS — D692 Other nonthrombocytopenic purpura: Secondary | ICD-10-CM | POA: Diagnosis not present

## 2023-01-09 DIAGNOSIS — M79669 Pain in unspecified lower leg: Secondary | ICD-10-CM | POA: Diagnosis not present

## 2023-01-09 DIAGNOSIS — M545 Low back pain, unspecified: Secondary | ICD-10-CM | POA: Diagnosis not present

## 2023-02-13 DIAGNOSIS — M545 Low back pain, unspecified: Secondary | ICD-10-CM | POA: Diagnosis not present

## 2023-02-13 DIAGNOSIS — M79669 Pain in unspecified lower leg: Secondary | ICD-10-CM | POA: Diagnosis not present

## 2023-02-20 DIAGNOSIS — Z23 Encounter for immunization: Secondary | ICD-10-CM | POA: Diagnosis not present

## 2023-02-20 DIAGNOSIS — I10 Essential (primary) hypertension: Secondary | ICD-10-CM | POA: Diagnosis not present

## 2023-02-20 DIAGNOSIS — I2510 Atherosclerotic heart disease of native coronary artery without angina pectoris: Secondary | ICD-10-CM | POA: Diagnosis not present

## 2023-02-20 DIAGNOSIS — M810 Age-related osteoporosis without current pathological fracture: Secondary | ICD-10-CM | POA: Diagnosis not present

## 2023-02-25 DIAGNOSIS — Z0000 Encounter for general adult medical examination without abnormal findings: Secondary | ICD-10-CM | POA: Diagnosis not present

## 2023-02-25 DIAGNOSIS — Z6821 Body mass index (BMI) 21.0-21.9, adult: Secondary | ICD-10-CM | POA: Diagnosis not present

## 2023-02-25 DIAGNOSIS — Z1239 Encounter for other screening for malignant neoplasm of breast: Secondary | ICD-10-CM | POA: Diagnosis not present

## 2023-03-11 IMAGING — DX DG FINGER RING 2+V*R*
3 series · 3 of 3 positions shown · non-contrast
Comparison: None.

CLINICAL DATA: Pain

EXAM:
RIGHT RING FINGER 2+V

[finger ap]
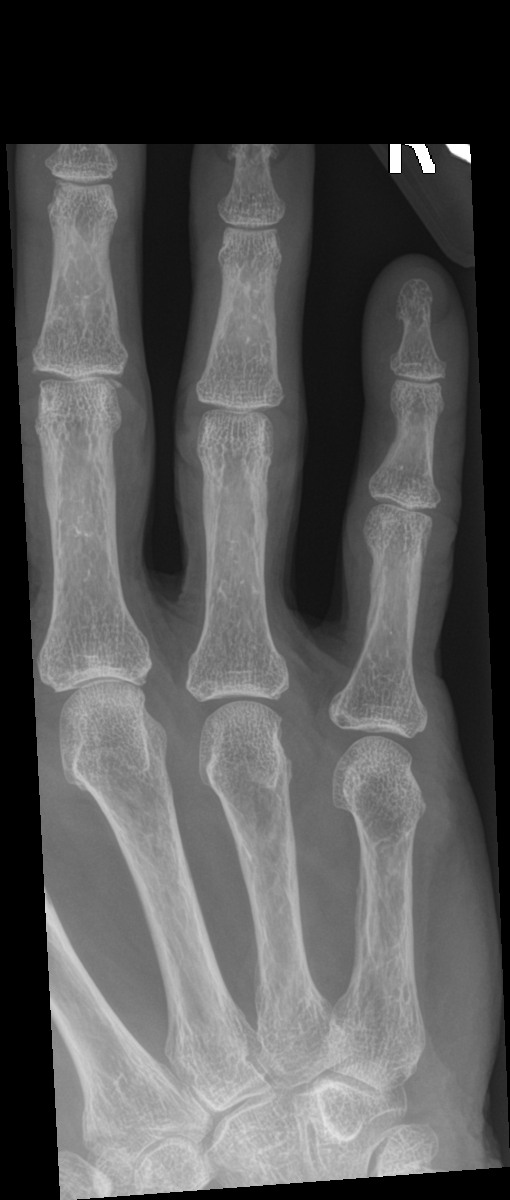

[finger obl]
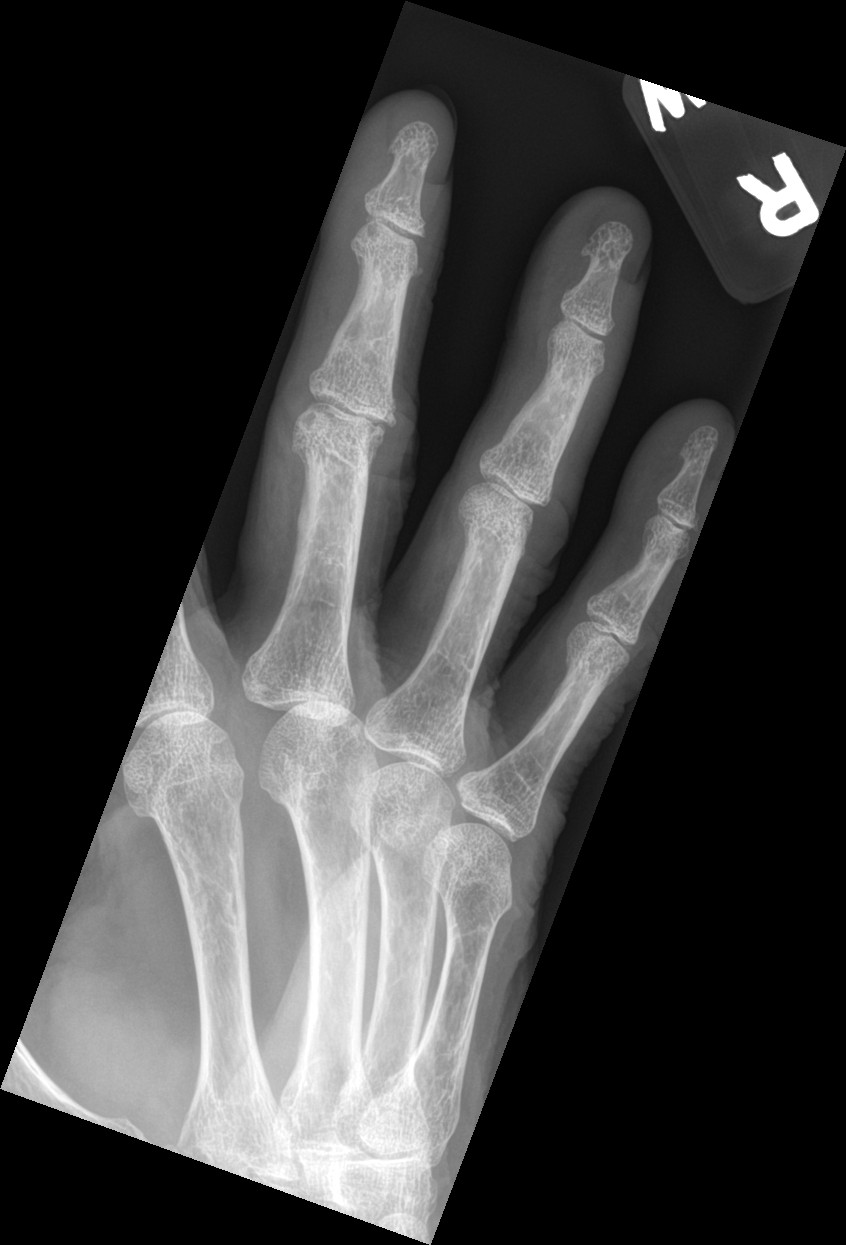

[finger lat]
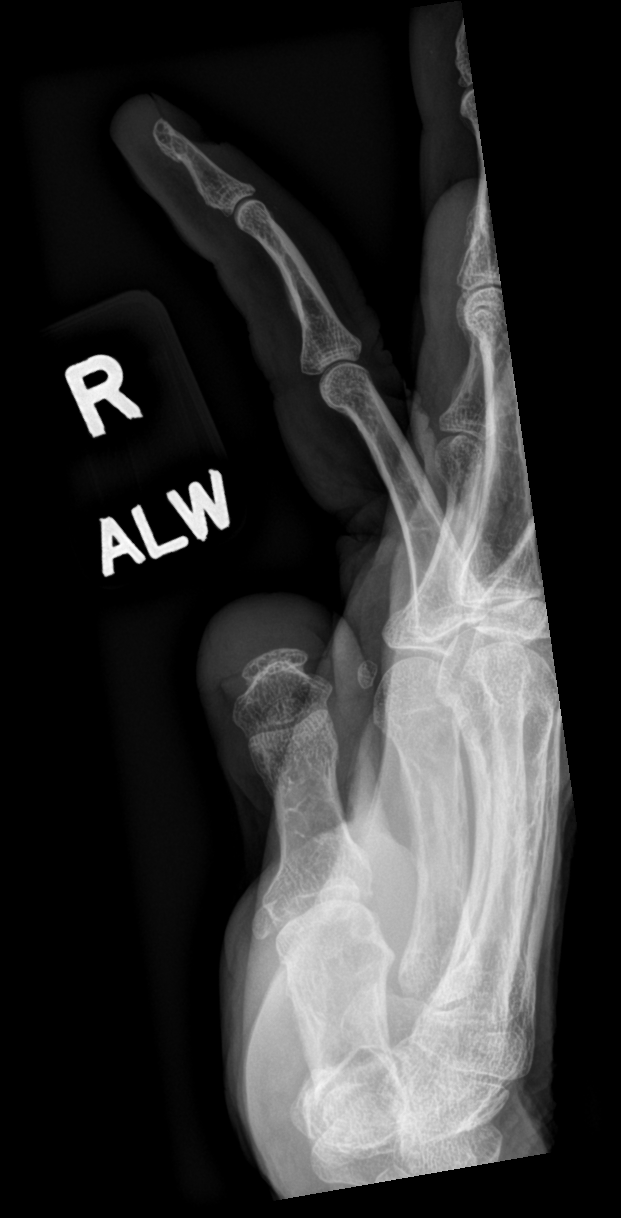

[3 of 3 positions shown; findings below may reference images not displayed]

FINDINGS: No fracture or dislocation is seen. There are no focal lytic
lesions. There are no opaque foreign bodies. Degenerative changes
are noted in the interphalangeal joints in the middle finger, more
so in the PIP joint.
IMPRESSION: No radiographic abnormality is seen in the right ring finger.
Degenerative changes are noted in the interphalangeal joints of
right middle finger.

## 2023-04-01 DIAGNOSIS — Z6821 Body mass index (BMI) 21.0-21.9, adult: Secondary | ICD-10-CM | POA: Diagnosis not present

## 2023-04-10 DIAGNOSIS — M79669 Pain in unspecified lower leg: Secondary | ICD-10-CM | POA: Diagnosis not present

## 2023-04-10 DIAGNOSIS — M545 Low back pain, unspecified: Secondary | ICD-10-CM | POA: Diagnosis not present

## 2023-05-01 DIAGNOSIS — Z1231 Encounter for screening mammogram for malignant neoplasm of breast: Secondary | ICD-10-CM | POA: Diagnosis not present

## 2023-07-10 DIAGNOSIS — H40033 Anatomical narrow angle, bilateral: Secondary | ICD-10-CM | POA: Diagnosis not present

## 2023-07-10 DIAGNOSIS — H25813 Combined forms of age-related cataract, bilateral: Secondary | ICD-10-CM | POA: Diagnosis not present

## 2023-07-10 DIAGNOSIS — H40023 Open angle with borderline findings, high risk, bilateral: Secondary | ICD-10-CM | POA: Diagnosis not present

## 2023-07-24 DIAGNOSIS — M545 Low back pain, unspecified: Secondary | ICD-10-CM | POA: Diagnosis not present

## 2023-07-24 DIAGNOSIS — M79669 Pain in unspecified lower leg: Secondary | ICD-10-CM | POA: Diagnosis not present

## 2023-08-21 DIAGNOSIS — M79669 Pain in unspecified lower leg: Secondary | ICD-10-CM | POA: Diagnosis not present

## 2023-08-21 DIAGNOSIS — M545 Low back pain, unspecified: Secondary | ICD-10-CM | POA: Diagnosis not present

## 2023-08-27 DIAGNOSIS — E785 Hyperlipidemia, unspecified: Secondary | ICD-10-CM | POA: Diagnosis not present

## 2023-08-27 DIAGNOSIS — I2510 Atherosclerotic heart disease of native coronary artery without angina pectoris: Secondary | ICD-10-CM | POA: Diagnosis not present

## 2023-08-27 DIAGNOSIS — I10 Essential (primary) hypertension: Secondary | ICD-10-CM | POA: Diagnosis not present

## 2023-08-27 DIAGNOSIS — Z6821 Body mass index (BMI) 21.0-21.9, adult: Secondary | ICD-10-CM | POA: Diagnosis not present

## 2023-08-27 DIAGNOSIS — M810 Age-related osteoporosis without current pathological fracture: Secondary | ICD-10-CM | POA: Diagnosis not present

## 2023-08-27 DIAGNOSIS — E559 Vitamin D deficiency, unspecified: Secondary | ICD-10-CM | POA: Diagnosis not present

## 2023-10-01 DIAGNOSIS — M545 Low back pain, unspecified: Secondary | ICD-10-CM | POA: Diagnosis not present

## 2023-10-01 DIAGNOSIS — M79669 Pain in unspecified lower leg: Secondary | ICD-10-CM | POA: Diagnosis not present

## 2023-11-06 DIAGNOSIS — M79669 Pain in unspecified lower leg: Secondary | ICD-10-CM | POA: Diagnosis not present

## 2023-11-06 DIAGNOSIS — M545 Low back pain, unspecified: Secondary | ICD-10-CM | POA: Diagnosis not present

## 2023-12-11 DIAGNOSIS — M545 Low back pain, unspecified: Secondary | ICD-10-CM | POA: Diagnosis not present

## 2023-12-11 DIAGNOSIS — M79669 Pain in unspecified lower leg: Secondary | ICD-10-CM | POA: Diagnosis not present

## 2024-01-09 DIAGNOSIS — H40023 Open angle with borderline findings, high risk, bilateral: Secondary | ICD-10-CM | POA: Diagnosis not present

## 2024-01-09 DIAGNOSIS — H40033 Anatomical narrow angle, bilateral: Secondary | ICD-10-CM | POA: Diagnosis not present

## 2024-01-14 DIAGNOSIS — D485 Neoplasm of uncertain behavior of skin: Secondary | ICD-10-CM | POA: Diagnosis not present

## 2024-01-14 DIAGNOSIS — Z85828 Personal history of other malignant neoplasm of skin: Secondary | ICD-10-CM | POA: Diagnosis not present

## 2024-01-14 DIAGNOSIS — B079 Viral wart, unspecified: Secondary | ICD-10-CM | POA: Diagnosis not present

## 2024-01-14 DIAGNOSIS — L821 Other seborrheic keratosis: Secondary | ICD-10-CM | POA: Diagnosis not present

## 2024-01-14 DIAGNOSIS — L570 Actinic keratosis: Secondary | ICD-10-CM | POA: Diagnosis not present

## 2024-01-14 DIAGNOSIS — D692 Other nonthrombocytopenic purpura: Secondary | ICD-10-CM | POA: Diagnosis not present

## 2024-01-15 DIAGNOSIS — M545 Low back pain, unspecified: Secondary | ICD-10-CM | POA: Diagnosis not present

## 2024-01-15 DIAGNOSIS — M79669 Pain in unspecified lower leg: Secondary | ICD-10-CM | POA: Diagnosis not present

## 2024-02-19 DIAGNOSIS — M79669 Pain in unspecified lower leg: Secondary | ICD-10-CM | POA: Diagnosis not present

## 2024-02-19 DIAGNOSIS — M545 Low back pain, unspecified: Secondary | ICD-10-CM | POA: Diagnosis not present

## 2024-02-24 DIAGNOSIS — Z1331 Encounter for screening for depression: Secondary | ICD-10-CM | POA: Diagnosis not present

## 2024-02-24 DIAGNOSIS — I10 Essential (primary) hypertension: Secondary | ICD-10-CM | POA: Diagnosis not present

## 2024-02-24 DIAGNOSIS — M810 Age-related osteoporosis without current pathological fracture: Secondary | ICD-10-CM | POA: Diagnosis not present

## 2024-02-24 DIAGNOSIS — Z0000 Encounter for general adult medical examination without abnormal findings: Secondary | ICD-10-CM | POA: Diagnosis not present

## 2024-02-24 DIAGNOSIS — E559 Vitamin D deficiency, unspecified: Secondary | ICD-10-CM | POA: Diagnosis not present

## 2024-02-24 DIAGNOSIS — Z23 Encounter for immunization: Secondary | ICD-10-CM | POA: Diagnosis not present

## 2024-02-24 DIAGNOSIS — I2510 Atherosclerotic heart disease of native coronary artery without angina pectoris: Secondary | ICD-10-CM | POA: Diagnosis not present

## 2024-02-24 DIAGNOSIS — E785 Hyperlipidemia, unspecified: Secondary | ICD-10-CM | POA: Diagnosis not present

## 2024-03-23 DIAGNOSIS — Z23 Encounter for immunization: Secondary | ICD-10-CM | POA: Diagnosis not present

## 2024-04-01 DIAGNOSIS — N958 Other specified menopausal and perimenopausal disorders: Secondary | ICD-10-CM | POA: Diagnosis not present

## 2024-04-01 DIAGNOSIS — M79669 Pain in unspecified lower leg: Secondary | ICD-10-CM | POA: Diagnosis not present

## 2024-04-01 DIAGNOSIS — Z124 Encounter for screening for malignant neoplasm of cervix: Secondary | ICD-10-CM | POA: Diagnosis not present

## 2024-04-01 DIAGNOSIS — Z6821 Body mass index (BMI) 21.0-21.9, adult: Secondary | ICD-10-CM | POA: Diagnosis not present

## 2024-04-01 DIAGNOSIS — M545 Low back pain, unspecified: Secondary | ICD-10-CM | POA: Diagnosis not present

## 2024-04-01 DIAGNOSIS — M816 Localized osteoporosis [Lequesne]: Secondary | ICD-10-CM | POA: Diagnosis not present

## 2024-04-15 DIAGNOSIS — Z1272 Encounter for screening for malignant neoplasm of vagina: Secondary | ICD-10-CM | POA: Diagnosis not present
